# Patient Record
Sex: Female | Born: 1950 | Race: White | Hispanic: No | Marital: Married | Smoking: Never smoker
Health system: Southern US, Community
[De-identification: ages and names within clinical notes are randomized; demographics above are authoritative.]

## PROBLEM LIST (undated history)

## (undated) DIAGNOSIS — Z780 Asymptomatic menopausal state: Secondary | ICD-10-CM

## (undated) DIAGNOSIS — F32A Depression, unspecified: Secondary | ICD-10-CM

## (undated) DIAGNOSIS — B009 Herpesviral infection, unspecified: Secondary | ICD-10-CM

## (undated) DIAGNOSIS — M7070 Other bursitis of hip, unspecified hip: Secondary | ICD-10-CM

## (undated) DIAGNOSIS — N858 Other specified noninflammatory disorders of uterus: Secondary | ICD-10-CM

## (undated) DIAGNOSIS — M1990 Unspecified osteoarthritis, unspecified site: Secondary | ICD-10-CM

## (undated) DIAGNOSIS — M810 Age-related osteoporosis without current pathological fracture: Secondary | ICD-10-CM

## (undated) DIAGNOSIS — N941 Unspecified dyspareunia: Secondary | ICD-10-CM

## (undated) DIAGNOSIS — C801 Malignant (primary) neoplasm, unspecified: Secondary | ICD-10-CM

## (undated) DIAGNOSIS — F419 Anxiety disorder, unspecified: Secondary | ICD-10-CM

## (undated) DIAGNOSIS — E039 Hypothyroidism, unspecified: Secondary | ICD-10-CM

## (undated) DIAGNOSIS — T753XXA Motion sickness, initial encounter: Secondary | ICD-10-CM

## (undated) DIAGNOSIS — K219 Gastro-esophageal reflux disease without esophagitis: Secondary | ICD-10-CM

## (undated) DIAGNOSIS — N83209 Unspecified ovarian cyst, unspecified side: Secondary | ICD-10-CM

## (undated) DIAGNOSIS — F329 Major depressive disorder, single episode, unspecified: Secondary | ICD-10-CM

## (undated) HISTORY — DX: Other bursitis of hip, unspecified hip: M70.70

## (undated) HISTORY — DX: Unspecified ovarian cyst, unspecified side: N83.209

## (undated) HISTORY — PX: BASAL CELL CARCINOMA EXCISION: SHX1214

## (undated) HISTORY — PX: BILATERAL SALPINGOOPHORECTOMY: SHX1223

## (undated) HISTORY — PX: MYOMECTOMY: SHX85

## (undated) HISTORY — DX: Age-related osteoporosis without current pathological fracture: M81.0

## (undated) HISTORY — PX: UPPER GI ENDOSCOPY: SHX6162

## (undated) HISTORY — DX: Herpesviral infection, unspecified: B00.9

## (undated) HISTORY — PX: OOPHORECTOMY: SHX86

## (undated) HISTORY — DX: Unspecified dyspareunia: N94.10

## (undated) HISTORY — DX: Other specified noninflammatory disorders of uterus: N85.8

## (undated) HISTORY — DX: Asymptomatic menopausal state: Z78.0

---

## 1977-12-29 HISTORY — PX: AUGMENTATION MAMMAPLASTY: SUR837

## 2006-01-02 ENCOUNTER — Ambulatory Visit: Payer: Self-pay | Admitting: Family Medicine

## 2006-01-06 ENCOUNTER — Ambulatory Visit: Payer: Self-pay | Admitting: Family Medicine

## 2006-04-30 ENCOUNTER — Ambulatory Visit: Payer: Self-pay | Admitting: Surgery

## 2006-11-03 ENCOUNTER — Ambulatory Visit: Payer: Self-pay | Admitting: Surgery

## 2007-02-15 ENCOUNTER — Ambulatory Visit: Payer: Self-pay | Admitting: Family Medicine

## 2008-02-14 ENCOUNTER — Ambulatory Visit: Payer: Self-pay | Admitting: Family Medicine

## 2008-02-24 ENCOUNTER — Ambulatory Visit: Payer: Self-pay | Admitting: Family Medicine

## 2009-02-26 ENCOUNTER — Ambulatory Visit: Payer: Self-pay | Admitting: Family Medicine

## 2010-03-11 ENCOUNTER — Ambulatory Visit: Payer: Self-pay | Admitting: Family Medicine

## 2011-03-20 ENCOUNTER — Ambulatory Visit: Payer: Self-pay | Admitting: Family Medicine

## 2011-12-30 HISTORY — PX: BREAST BIOPSY: SHX20

## 2012-03-23 ENCOUNTER — Ambulatory Visit: Payer: Self-pay | Admitting: Family Medicine

## 2012-03-25 ENCOUNTER — Ambulatory Visit: Payer: Self-pay | Admitting: Family Medicine

## 2012-06-28 ENCOUNTER — Ambulatory Visit: Payer: Self-pay | Admitting: Surgery

## 2012-07-26 ENCOUNTER — Ambulatory Visit: Payer: Self-pay | Admitting: Surgery

## 2012-07-28 LAB — PATHOLOGY REPORT

## 2012-08-24 DIAGNOSIS — C44310 Basal cell carcinoma of skin of unspecified parts of face: Secondary | ICD-10-CM | POA: Insufficient documentation

## 2013-03-30 ENCOUNTER — Ambulatory Visit: Payer: Self-pay | Admitting: Family Medicine

## 2013-08-26 DIAGNOSIS — Z85828 Personal history of other malignant neoplasm of skin: Secondary | ICD-10-CM | POA: Insufficient documentation

## 2013-08-26 DIAGNOSIS — Z87898 Personal history of other specified conditions: Secondary | ICD-10-CM | POA: Insufficient documentation

## 2014-03-29 LAB — HM PAP SMEAR

## 2014-04-11 ENCOUNTER — Ambulatory Visit: Payer: Self-pay | Admitting: Family Medicine

## 2014-05-16 DIAGNOSIS — M7070 Other bursitis of hip, unspecified hip: Secondary | ICD-10-CM | POA: Insufficient documentation

## 2014-06-29 DIAGNOSIS — M810 Age-related osteoporosis without current pathological fracture: Secondary | ICD-10-CM | POA: Insufficient documentation

## 2014-06-29 DIAGNOSIS — E8940 Asymptomatic postprocedural ovarian failure: Secondary | ICD-10-CM | POA: Insufficient documentation

## 2014-06-29 DIAGNOSIS — F419 Anxiety disorder, unspecified: Secondary | ICD-10-CM | POA: Insufficient documentation

## 2014-06-29 DIAGNOSIS — E039 Hypothyroidism, unspecified: Secondary | ICD-10-CM | POA: Insufficient documentation

## 2014-06-29 DIAGNOSIS — F329 Major depressive disorder, single episode, unspecified: Secondary | ICD-10-CM | POA: Insufficient documentation

## 2014-06-29 DIAGNOSIS — F32A Depression, unspecified: Secondary | ICD-10-CM | POA: Insufficient documentation

## 2014-08-10 ENCOUNTER — Ambulatory Visit: Payer: Self-pay | Admitting: Unknown Physician Specialty

## 2014-08-28 DIAGNOSIS — M5136 Other intervertebral disc degeneration, lumbar region: Secondary | ICD-10-CM | POA: Insufficient documentation

## 2014-08-28 DIAGNOSIS — M5410 Radiculopathy, site unspecified: Secondary | ICD-10-CM | POA: Insufficient documentation

## 2014-09-19 ENCOUNTER — Ambulatory Visit: Payer: Self-pay | Admitting: Obstetrics and Gynecology

## 2014-09-19 LAB — CBC
HCT: 41.8 % (ref 35.0–47.0)
HGB: 14.1 g/dL (ref 12.0–16.0)
MCH: 31.2 pg (ref 26.0–34.0)
MCHC: 33.8 g/dL (ref 32.0–36.0)
MCV: 92 fL (ref 80–100)
Platelet: 188 10*3/uL (ref 150–440)
RBC: 4.52 10*6/uL (ref 3.80–5.20)
RDW: 13 % (ref 11.5–14.5)
WBC: 4.2 10*3/uL (ref 3.6–11.0)

## 2014-09-19 LAB — BASIC METABOLIC PANEL
ANION GAP: 3 — AB (ref 7–16)
BUN: 14 mg/dL (ref 7–18)
CHLORIDE: 104 mmol/L (ref 98–107)
CO2: 32 mmol/L (ref 21–32)
CREATININE: 0.67 mg/dL (ref 0.60–1.30)
Calcium, Total: 9.4 mg/dL (ref 8.5–10.1)
EGFR (Non-African Amer.): >60
Glucose: 83 mg/dL (ref 65–99)
Osmolality: 277 (ref 275–301)
Potassium: 3.9 mmol/L (ref 3.5–5.1)
Sodium: 139 mmol/L (ref 136–145)

## 2014-09-25 ENCOUNTER — Ambulatory Visit: Payer: Self-pay | Admitting: Obstetrics and Gynecology

## 2014-09-28 LAB — PATHOLOGY REPORT

## 2014-11-06 DIAGNOSIS — Z9109 Other allergy status, other than to drugs and biological substances: Secondary | ICD-10-CM | POA: Insufficient documentation

## 2015-04-21 NOTE — Op Note (Signed)
PATIENT NAME:  Margaret Berg, Margaret Berg MR#:  [id number] DATE OF BIRTH:  20-Aug-1951  DATE OF PROCEDURE:  09/25/2014  PREOPERATIVE DIAGNOSIS: Right adnexal mass.   POSTOPERATIVE DIAGNOSIS: Pedunculated leiomyoma uteri.   OPERATIVE PROCEDURE: Laparoscopic bilateral salpingo-oophorectomy and myomectomy.   SURGEON: Dr. Enzo Bi.   FIRST ASSISTANT: Dr. Marcelline Mates.   ANESTHESIA: General endotracheal.   INDICATIONS: The patient is a 64 year old white female, P2, 0-0-2, menopausal, on Prempro HRT therapy, who presents for surgical excision of right adnexal mass BSO. During recent workup for hip pain, MRI demonstrated a solid lesion with vascular blood flow adjacent to the uterus on the right side. Ultrasound confirmed findings. She is here for definitive treatment.   FINDINGS AT SURGERY: Revealed a 4 x 2-cm pedunculated fibroid extending from the right uterine cornu. There also was a 0.5-cm fibroid coming off of the left uterine cornu. Both lesions were excised. The tubes and ovaries were grossly normal bilaterally.   DESCRIPTION OF PROCEDURE: The patient was brought to the operating room where she was placed in the supine position. General endotracheal anesthesia was induced without difficulty. She was placed in the dorsal lithotomy position using the bumblebee stirrups. A ChloraPrep and Betadine abdominal, perineal, intravaginal prep and drape was performed in standard fashion. A red Robinson catheter was used to drain 75 mL of urine from the bladder. A Hulka tenaculum was placed onto the cervix. The endocervical canal had to be dilated with Hanks dilators. Small uterine perforation was noted which was asymptomatic and nonbleeding at the end of the case. Following placement of the Hulka tenaculum, the laparoscopy was performed.   A subumbilical vertical incision 5 mm in length was made. The Optiview laparoscopic trocar system was used to place a 5-mm camera directly into the subumbilical incision under direct  visualization. No bowel or vascular injury was encountered. Pneumoperitoneum was created with CO2 gas. An 11-mm port was placed under direct visualization in the right lower quadrant. A 5-mm port was placed likewise in the left lower quadrant. The above-noted findings were photo documented. The myomectomy was performed using the Ace Harmonic scalpel, cutting across the base of the pedunculated fibroid. Following the myomectomy, the bilateral salpingo-oophorectomy was performed in routine manner. A grasper was used to isolate the adnexa. The Ace Harmonic scalpel was used to desiccate, coagulate, and excise the tube and ovary through sequential bites. Once the adnexa were removed, the suture line was cauterized using Kleppinger bipolar forceps in order to optimize hemostasis. A similar procedure was carried out until on the contralateral tube. Finally, a small 0.5-cm subserosal fibroid at the left cornu of the uterus was excised using the Ace Harmonic scalpel. Again, Kleppinger bipolar cautery was used for hemostasis. The tubes and ovaries were removed through the 11-mm port. The EndoCatch bag system was then placed with the fibroid being placed into the bag for removal. The 11-mm port site was slightly dilated in order to allow complete removal of the fibroid intact. The pelvis was then laparoscopically inspected following removal of the specimens. Copious irrigation was performed and the irrigant fluid was aspirated. The procedure was then terminated with the abdomen being closed. The pneumoperitoneum was released. The incisions were closed with 0 Vicryl on the fascia in the 11-mm port site. Two figure-of-8 sutures were placed. The skin incisions were closed with 4-0 Vicryl. Dermabond glue was placed over the incisions. The patient was then awakened, extubated and taken to the recovery room in satisfactory condition.   ESTIMATED BLOOD LOSS: 25 mL.  IV FLUIDS: Were 600 mL.   URINE OUTPUT: 75 mL.   COUNTS:  All instrument, needle, and sponge counts were verified as correct.    ____________________________ Alanda Slim. Olon Russ, MD mad:lt D: 09/25/2014 14:47:04 ET T: 09/25/2014 15:23:37 ET JOB#: [id number]  cc: Hassell Done A. Syriana Croslin, MD, <Dictator>  Alanda Slim Chaundra Abreu MD ELECTRONICALLY SIGNED 09/28/2014 12:34

## 2015-05-17 ENCOUNTER — Other Ambulatory Visit: Payer: Self-pay | Admitting: Family Medicine

## 2015-05-17 DIAGNOSIS — Z1231 Encounter for screening mammogram for malignant neoplasm of breast: Secondary | ICD-10-CM

## 2015-06-22 ENCOUNTER — Other Ambulatory Visit: Payer: Self-pay | Admitting: Family Medicine

## 2015-06-22 ENCOUNTER — Ambulatory Visit
Admission: RE | Admit: 2015-06-22 | Discharge: 2015-06-22 | Disposition: A | Discharge status: Home / Self Care | Payer: 59 | Source: Ambulatory Visit | Attending: Family Medicine | Admitting: Family Medicine

## 2015-06-22 DIAGNOSIS — Z9882 Breast implant status: Secondary | ICD-10-CM | POA: Diagnosis not present

## 2015-06-22 DIAGNOSIS — Z1231 Encounter for screening mammogram for malignant neoplasm of breast: Secondary | ICD-10-CM | POA: Insufficient documentation

## 2015-06-25 ENCOUNTER — Other Ambulatory Visit: Payer: Self-pay | Admitting: Family Medicine

## 2015-06-25 DIAGNOSIS — N6489 Other specified disorders of breast: Secondary | ICD-10-CM

## 2015-06-25 DIAGNOSIS — R928 Other abnormal and inconclusive findings on diagnostic imaging of breast: Secondary | ICD-10-CM

## 2015-06-26 ENCOUNTER — Ambulatory Visit
Admission: RE | Admit: 2015-06-26 | Discharge: 2015-06-26 | Disposition: A | Discharge status: Home / Self Care | Payer: 59 | Source: Ambulatory Visit | Attending: Family Medicine | Admitting: Family Medicine

## 2015-06-26 ENCOUNTER — Other Ambulatory Visit: Payer: Self-pay | Admitting: Family Medicine

## 2015-06-26 DIAGNOSIS — N6489 Other specified disorders of breast: Secondary | ICD-10-CM | POA: Diagnosis present

## 2015-06-26 DIAGNOSIS — N6002 Solitary cyst of left breast: Secondary | ICD-10-CM | POA: Diagnosis not present

## 2015-06-26 DIAGNOSIS — R928 Other abnormal and inconclusive findings on diagnostic imaging of breast: Secondary | ICD-10-CM

## 2015-07-19 ENCOUNTER — Encounter: Payer: Self-pay | Admitting: *Deleted

## 2015-07-20 ENCOUNTER — Ambulatory Visit: Payer: 59 | Admitting: Anesthesiology

## 2015-07-20 ENCOUNTER — Encounter
Admission: RE | Disposition: A | Discharge status: Home / Self Care | Payer: Self-pay | Source: Ambulatory Visit | Attending: Gastroenterology

## 2015-07-20 ENCOUNTER — Ambulatory Visit
Admission: RE | Admit: 2015-07-20 | Discharge: 2015-07-20 | Disposition: A | Discharge status: Home / Self Care | Payer: 59 | Source: Ambulatory Visit | Attending: Gastroenterology | Admitting: Gastroenterology

## 2015-07-20 DIAGNOSIS — Z1211 Encounter for screening for malignant neoplasm of colon: Secondary | ICD-10-CM | POA: Diagnosis present

## 2015-07-20 DIAGNOSIS — D125 Benign neoplasm of sigmoid colon: Secondary | ICD-10-CM | POA: Insufficient documentation

## 2015-07-20 DIAGNOSIS — E039 Hypothyroidism, unspecified: Secondary | ICD-10-CM | POA: Insufficient documentation

## 2015-07-20 HISTORY — DX: Anxiety disorder, unspecified: F41.9

## 2015-07-20 HISTORY — DX: Major depressive disorder, single episode, unspecified: F32.9

## 2015-07-20 HISTORY — DX: Depression, unspecified: F32.A

## 2015-07-20 HISTORY — DX: Malignant (primary) neoplasm, unspecified: C80.1

## 2015-07-20 HISTORY — PX: COLONOSCOPY WITH PROPOFOL: SHX5780

## 2015-07-20 HISTORY — DX: Unspecified osteoarthritis, unspecified site: M19.90

## 2015-07-20 HISTORY — DX: Hypothyroidism, unspecified: E03.9

## 2015-07-20 SURGERY — COLONOSCOPY WITH PROPOFOL
Anesthesia: General

## 2015-07-20 MED ORDER — PROPOFOL 10 MG/ML IV BOLUS
INTRAVENOUS | Status: DC | PRN
  Administered 2015-07-20 (×4): 20 mg via INTRAVENOUS

## 2015-07-20 MED ORDER — SODIUM CHLORIDE 0.9 % IV SOLN
INTRAVENOUS | Status: DC
  Administered 2015-07-20: 1000 mL via INTRAVENOUS

## 2015-07-20 MED ORDER — SODIUM CHLORIDE 0.9 % IV SOLN
INTRAVENOUS | Status: DC
  Administered 2015-07-20: 12:00:00 via INTRAVENOUS

## 2015-07-20 MED ORDER — MIDAZOLAM HCL 2 MG/2ML IJ SOLN
INTRAMUSCULAR | Status: DC | PRN
  Administered 2015-07-20: 1 mg via INTRAVENOUS

## 2015-07-20 NOTE — Anesthesia Preprocedure Evaluation (Signed)
Anesthesia Evaluation  Patient identified by MRN, date of birth, ID band Patient awake    Reviewed: Allergy & Precautions, H&P , NPO status , Patient's Chart, lab work & pertinent test results, reviewed documented beta blocker date and time   Airway Mallampati: II  TM Distance: >3 FB Neck ROM: full    Dental no notable dental hx.    Pulmonary neg pulmonary ROS,  breath sounds clear to auscultation  Pulmonary exam normal       Cardiovascular Exercise Tolerance: Good negative cardio ROS  Rhythm:regular Rate:Normal     Neuro/Psych negative neurological ROS  negative psych ROS   GI/Hepatic negative GI ROS, Neg liver ROS,   Endo/Other  negative endocrine ROSHypothyroidism   Renal/GU negative Renal ROS  negative genitourinary   Musculoskeletal   Abdominal   Peds  Hematology negative hematology ROS (+)   Anesthesia Other Findings   Reproductive/Obstetrics negative OB ROS                             Anesthesia Physical Anesthesia Plan  ASA: II  Anesthesia Plan: General   Post-op Pain Management:    Induction:   Airway Management Planned:   Additional Equipment:   Intra-op Plan:   Post-operative Plan:   Informed Consent: I have reviewed the patients History and Physical, chart, labs and discussed the procedure including the risks, benefits and alternatives for the proposed anesthesia with the patient or authorized representative who has indicated his/her understanding and acceptance.   Dental Advisory Given  Plan Discussed with: Anesthesiologist  Anesthesia Plan Comments:         Anesthesia Quick Evaluation

## 2015-07-20 NOTE — Transfer of Care (Signed)
Immediate Anesthesia Transfer of Care Note  Patient: Margaret Berg  Procedure(s) Performed: Procedure(s): COLONOSCOPY WITH PROPOFOL (N/A)  Patient Location: PACU, Nursing Unit and Endoscopy Unit  Anesthesia Type:General  Level of Consciousness: oriented and sedated  Airway & Oxygen Therapy: Patient Spontanous Breathing  Post-op Assessment: Report given to RN  Post vital signs: stable  Last Vitals:  Filed Vitals:   07/20/15 1217  BP: 147/69  Pulse: 76  Temp: 36.6 C  Resp: 16    Complications: No apparent anesthesia complications

## 2015-07-20 NOTE — H&P (Signed)
Outpatient short stay form Pre-procedure 07/20/2015 11:43 AM Lollie Sails MD  Primary Physician: Dr. Thereasa Distance  Reason for visit:  Colonoscopy  History of present illness:  Patient is a 64 year old female presenting today for screening colonoscopy. Her last colonoscopy was 2007. It did not show any evidence of colon polyps. He tolerated her prep well. Takes no aspirin or anticoagulation treatment. There is no family history colon cancer colon polyps.    Current facility-administered medications:  .  0.9 %  sodium chloride infusion, , Intravenous, Continuous, Lollie Sails, MD .  0.9 %  sodium chloride infusion, , Intravenous, Continuous, Lollie Sails, MD, Last Rate: 20 mL/hr at 07/20/15 1141, 1,000 mL at 07/20/15 1141  Prescriptions prior to admission  Medication Sig Dispense Refill Last Dose  . cetirizine (ZYRTEC) 10 MG tablet Take 10 mg by mouth daily.   07/19/2015  . estrogen, conjugated,-medroxyprogesterone (PREMPRO) 0.3-1.5 MG per tablet Take 1 tablet by mouth daily.   07/19/2015  . LORazepam (ATIVAN) 0.5 MG tablet Take 0.5 mg by mouth at bedtime as needed for anxiety.   07/19/2015  . venlafaxine XR (EFFEXOR-XR) 150 MG 24 hr capsule Take 150 mg by mouth daily.   07/19/2015     Allergies  Allergen Reactions  . Codeine Nausea And Vomiting     Past Medical History  Diagnosis Date  . Cancer   . Anxiety   . Hypothyroidism   . Depression   . Arthritis     Review of systems:      Physical Exam    Heart and lungs: Regular rate and rhythm without rub murmur gallop, lungs are bilaterally clear.    HEENT: Normocephalic atraumatic eyes are anicteric    Other:     Pertinant exam for procedure: Soft nontender nondistended bowel sounds positive normoactive    Planned proceedures: Colonoscopy and indicated procedures I have discussed the risks benefits and complications of procedures to include not limited to bleeding, infection, perforation and the risk of  sedation and the patient wishes to proceed. I have discussed the risks benefits and complications of procedures to include not limited to bleeding, infection, perforation and the risk of sedation and the patient wishes to proceed.    Lollie Sails, MD Gastroenterology 07/20/2015  11:43 AM

## 2015-07-20 NOTE — Op Note (Signed)
[facility] Patient Name: Margaret Berg Procedure Date: 07/20/2015 11:27 AM MRN: [id number] Account #: [id number] Date of Birth: 1951/12/25 Admit Type: Outpatient Age: 64 Room: [facility] ENDO ROOM 2 Gender: Female Note Status: Finalized Procedure:         Colonoscopy Indications:       Screening for colorectal malignant neoplasm Providers:         Lollie Sails, MD Referring MD:      Sofie Hartigan (Referring MD) Medicines:         Monitored Anesthesia Care Complications:     No immediate complications. Procedure:         Pre-Anesthesia Assessment:                    - ASA Grade Assessment: II - A patient with mild systemic                     disease.                    After obtaining informed consent, the colonoscope was                     passed under direct vision. Throughout the procedure, the                     patient's blood pressure, pulse, and oxygen saturations                     were monitored continuously. The Olympus PCF-160AL                     colonoscope (S#. [id number]) was introduced through the anus                     and advanced to the the cecum, identified by appendiceal                     orifice and ileocecal valve. The colonoscopy was performed                     without difficulty. The patient tolerated the procedure                     well. The quality of the bowel preparation was good. Findings:      A 4 mm polyp was found in the proximal sigmoid colon. The polyp was       flat. The polyp was removed with a cold biopsy forceps. Resection and       retrieval were complete.      there is a smal mucosal limited split in the upper edge of a fold at       about 52 cm from the anal verge, noted on withdrawn, likely from scope       passage.      The exam was otherwise without abnormality.      The digital rectal exam was normal. Impression:        - One 4 mm polyp in the proximal sigmoid colon. Resected                 and retrieved.                    - The examination was otherwise normal. Recommendation:    - Await pathology results.                    -  Telephone GI clinic for pathology results in 1 week. Procedure Code(s): --- Professional ---                    [phone], Colonoscopy, flexible; with biopsy, single or                     multiple Diagnosis Code(s): --- Professional ---                    V76.51, Special screening for malignant neoplasms of colon                    211.3, Benign neoplasm of colon CPT copyright 2014 American Medical Association. All rights reserved. The codes documented in this report are preliminary and upon coder review may  be revised to meet current compliance requirements. Lollie Sails, MD 07/20/2015 12:20:25 PM This report has been signed electronically. Number of Addenda: 0 Note Initiated On: 07/20/2015 11:27 AM Scope Withdrawal Time: 0 hours 11 minutes 38 seconds  Total Procedure Duration: 0 hours 19 minutes 35 seconds       [facility]

## 2015-07-20 NOTE — Anesthesia Postprocedure Evaluation (Signed)
  Anesthesia Post-op Note  Patient: Margaret Berg  Procedure(s) Performed: Procedure(s): COLONOSCOPY WITH PROPOFOL (N/A)  Anesthesia type:General  Patient location: PACU  Post pain: Pain level controlled  Post assessment: Post-op Vital signs reviewed, Patient's Cardiovascular Status Stable, Respiratory Function Stable, Patent Airway and No signs of Nausea or vomiting  Post vital signs: Reviewed and stable  Last Vitals:  Filed Vitals:   07/20/15 1217  BP: 147/69  Pulse: 76  Temp: 36.6 C  Resp: 16    Level of consciousness: awake, alert  and patient cooperative  Complications: No apparent anesthesia complications

## 2015-07-23 ENCOUNTER — Encounter: Payer: Self-pay | Admitting: Gastroenterology

## 2015-07-24 LAB — SURGICAL PATHOLOGY

## 2016-01-14 ENCOUNTER — Other Ambulatory Visit: Payer: Self-pay | Admitting: Obstetrics and Gynecology

## 2016-02-26 ENCOUNTER — Encounter: Payer: Self-pay | Admitting: Obstetrics and Gynecology

## 2016-02-26 ENCOUNTER — Ambulatory Visit (INDEPENDENT_AMBULATORY_CARE_PROVIDER_SITE_OTHER): Payer: 59 | Admitting: Obstetrics and Gynecology

## 2016-02-26 VITALS — BP 148/77 | HR 86 | Ht 63.0 in | Wt 146.2 lb

## 2016-02-26 DIAGNOSIS — N951 Menopausal and female climacteric states: Secondary | ICD-10-CM | POA: Diagnosis not present

## 2016-02-26 DIAGNOSIS — M810 Age-related osteoporosis without current pathological fracture: Secondary | ICD-10-CM

## 2016-02-26 DIAGNOSIS — Z79890 Hormone replacement therapy (postmenopausal): Secondary | ICD-10-CM

## 2016-02-26 MED ORDER — CONJ ESTROG-MEDROXYPROGEST ACE 0.3-1.5 MG PO TABS: 1.0000 | ORAL_TABLET | Freq: Every day | ORAL | Status: DC

## 2016-02-26 NOTE — Progress Notes (Signed)
Patient ID: Margaret Berg, female   DOB: 1951/09/30, 65 y.o.   MRN: [id number] ANNUAL PREVENTATIVE CARE GYN  ENCOUNTER NOTE  Subjective:       Margaret Berg is a 65 y.o. G60P2002 female here for a routine annual gynecologic exam.  Current complaints: 1.  none  2.  Status post laparoscopic BSO and myomectomy, on HRT, asymptomatic   Gynecologic History No LMP recorded. Patient is postmenopausal. Contraception: post menopausal status Last Pap: 03/2014. Results were: normal Last mammogram: 2016 thru pcp. Results were: normal Status post laparoscopic BSO and myomectomy. On Prempro, asymptomatic; patient desires to continue  Obstetric History OB History  Gravida Para Term Preterm AB SAB TAB Ectopic Multiple Living  2 2 2       2     # Outcome Date GA Lbr Len/2nd Weight Sex Delivery Anes PTL Lv  2 Term 1971   8 lb 2.2 oz (3.692 kg) M Vag-Spont   Y  1 Term 1969     Vag-Spont   Y      Past Medical History  Diagnosis Date  . Anxiety   . Hypothyroidism   . HSV-2 (herpes simplex virus 2) infection   . Arthritis   . Bursitis of hip     left  . Cancer ([place])     basal cell  . Depression   . Dyspareunia, female   . Menopause   . Ovarian cystic mass   . Osteoporosis   . Uterine mass     Past Surgical History  Procedure Laterality Date  . Colonoscopy with propofol N/A 07/20/2015    Procedure: COLONOSCOPY WITH PROPOFOL;  Surgeon: Lollie Sails, MD;  Location: [facility] ENDOSCOPY;  Service: Endoscopy;  Laterality: N/A;  . Breast biopsy Right     ultrasound guided benign  . Augmentation mammaplasty Bilateral 1979  . Bilateral salpingoophorectomy    . Myomectomy    . Basal cell carcinoma excision      Current Outpatient Prescriptions on File Prior to Visit  Medication Sig Dispense Refill  . cetirizine (ZYRTEC) 10 MG tablet Take 10 mg by mouth daily.    . Cholecalciferol (VITAMIN D3) 2000 units capsule Take by mouth.    Marland Kitchen LORazepam (ATIVAN) 0.5 MG tablet Take 0.5 mg by mouth at  bedtime as needed for anxiety.    Marland Kitchen PREMPRO 0.3-1.5 MG tablet Take 1 tablet by mouth  daily 84 tablet 0  . venlafaxine XR (EFFEXOR-XR) 150 MG 24 hr capsule Take 150 mg by mouth daily.     No current facility-administered medications on file prior to visit.    Allergies  Allergen Reactions  . Codeine Nausea And Vomiting    Social History   Social History  . Marital Status: Married    Spouse Name: N/A  . Number of Children: N/A  . Years of Education: N/A   Occupational History  . Not on file.   Social History Main Topics  . Smoking status: Never Smoker   . Smokeless tobacco: Not on file  . Alcohol Use: Yes     Comment: occas  . Drug Use: No  . Sexual Activity: Yes    Birth Control/ Protection: Surgical   Other Topics Concern  . Not on file   Social History Narrative    Family History  Problem Relation Age of Onset  . Osteoporosis Maternal Grandmother   . Diabetes Maternal Grandfather   . Cancer Neg Hx   . Heart disease Neg Hx  The following portions of the patient's history were reviewed and updated as appropriate: allergies, current medications, past family history, past medical history, past social history, past surgical history and problem list.  Review of Systems ROS Review of Systems - General ROS: negative for - chills, fatigue, fever, hot flashes, night sweats, weight gain or weight loss Psychological ROS: negative for - anxiety, decreased libido, depression, mood swings, physical abuse or sexual abuse Ophthalmic ROS: negative for - blurry vision, eye pain or loss of vision ENT ROS: negative for - headaches, hearing change, visual changes or vocal changes Allergy and Immunology ROS: negative for - hives, itchy/watery eyes or seasonal allergies Hematological and Lymphatic ROS: negative for - bleeding problems, bruising, swollen lymph nodes or weight loss Endocrine ROS: negative for - galactorrhea, hair pattern changes, hot flashes, malaise/lethargy, mood  swings, palpitations, polydipsia/polyuria, skin changes, temperature intolerance or unexpected weight changes Breast ROS: negative for - new or changing breast lumps or nipple discharge Respiratory ROS: negative for - cough or shortness of breath Cardiovascular ROS: negative for - chest pain, irregular heartbeat, palpitations or shortness of breath Gastrointestinal ROS: no abdominal pain, change in bowel habits, or black or bloody stools Genito-Urinary ROS: no dysuria, trouble voiding, or hematuria Musculoskeletal ROS: negative for - joint pain or joint stiffness Neurological ROS: negative for - bowel and bladder control changes Dermatological ROS: negative for rash and skin lesion changes   Objective:   BP 148/77 mmHg  Pulse 86  Ht 5\' 3"  (1.6 m)  Wt 146 lb 3.2 oz (66.316 kg)  BMI 25.90 kg/m2 CONSTITUTIONAL: Well-developed, well-nourished female in no acute distress.  PSYCHIATRIC: Normal mood and affect. Normal behavior. Normal judgment and thought content. [place]: Alert and oriented to person, place, and time. Normal muscle tone coordination. No cranial nerve deficit noted. HENT:  Normocephalic, atraumatic EYES: Conjunctivae and EOM are normal.  NECK: Normal range of motion, supple, no masses.  Normal thyroid.  SKIN: Skin is warm and dry. No rash noted. Not diaphoretic. No erythema. No pallor. CARDIOVASCULAR: not examined RESPIRATORY: Not examined BREASTS: Symmetric in size. No masses, skin changes, nipple drainage, or lymphadenopathy. Breast implants present ABDOMEN: Soft, normal bowel sounds, no distention noted.  No tenderness, rebound or guarding. No hernias BLADDER: Normal PELVIC:  External Genitalia: Normal  BUS: Normal  Vagina: Normal  Cervix: Normal  Uterus: Normal  Adnexa: Normal  RV: External Exam NormaI, No Rectal Masses and Normal Sphincter tone, thin sphincter and rectovaginal septum  MUSCULOSKELETAL: Normal range of motion. No tenderness.  No cyanosis, clubbing,  or edema.  2+ distal pulses. LYMPHATIC: No Axillary, Supraclavicular, or Inguinal Adenopathy.    Assessment:   Annual gynecologic examination 65 y.o. Contraception: post menopausal status Normal BMI Status post laparoscopic BSO and myomectomy 2016 On Prempro, asymptomatic, desires to continue  Plan:  Pap: Not needed Mammogram: thru pcp Stool Guaiac Testing:  Not Indicated- had colonoscopy 06/2015 - wnl Labs: thru pcp Routine preventative health maintenance measures emphasized: Exercise/Diet/Weight control, Tobacco Warnings and Alcohol/Substance use risks Refill Prempro. Continue with calcium and vitamin D supplementation Return to [place], [place]  Brayton Mars, MD

## 2016-02-26 NOTE — Patient Instructions (Signed)
1. No pap needed. 2. Mammogram scheduled 3. Prempro is refilled. 4. Increase exercise to maintain weight and health. 5. Return in 1 year.

## 2016-04-11 ENCOUNTER — Ambulatory Visit (INDEPENDENT_AMBULATORY_CARE_PROVIDER_SITE_OTHER): Payer: Medicare Other

## 2016-04-11 ENCOUNTER — Ambulatory Visit
Admission: EM | Admit: 2016-04-11 | Discharge: 2016-04-11 | Disposition: A | Discharge status: Home / Self Care | Payer: Medicare Other | Attending: Family Medicine | Admitting: Family Medicine

## 2016-04-11 ENCOUNTER — Encounter: Payer: Self-pay | Admitting: *Deleted

## 2016-04-11 DIAGNOSIS — R062 Wheezing: Secondary | ICD-10-CM

## 2016-04-11 DIAGNOSIS — J189 Pneumonia, unspecified organism: Secondary | ICD-10-CM

## 2016-04-11 MED ORDER — ALBUTEROL SULFATE HFA 108 (90 BASE) MCG/ACT IN AERS: 1.0000 | INHALATION_SPRAY | Freq: Four times a day (QID) | RESPIRATORY_TRACT | Status: DC | PRN

## 2016-04-11 MED ORDER — LEVOFLOXACIN 500 MG PO TABS: 500.0000 mg | ORAL_TABLET | Freq: Every day | ORAL | Status: DC

## 2016-04-11 MED ORDER — HYDROCOD POLST-CPM POLST ER 10-8 MG/5ML PO SUER: 5.0000 mL | Freq: Two times a day (BID) | ORAL | Status: DC | PRN

## 2016-04-11 NOTE — ED Notes (Signed)
Non-productive cough x3 weeks, onset of fever this past week, and last night onset of left side chest wall pain with coughing and movement.

## 2016-04-11 NOTE — ED Provider Notes (Signed)
CSN: [id number]     Arrival date & time 04/11/16  1431 History   First MD Initiated Contact with Patient 04/11/16 1544     Chief Complaint  Patient presents with  . Cough  . Pleurisy  . Fever   (Consider location/radiation/quality/duration/timing/severity/associated sxs/prior Treatment) Patient is a 65 y.o. female presenting with URI. The history is provided by the patient.  URI Presenting symptoms: congestion, cough and fever   Severity:  Moderate Onset quality:  Sudden Duration:  3 weeks Timing:  Constant Progression:  Worsening Chronicity:  New Relieved by:  Nothing Ineffective treatments:  OTC medications Associated symptoms: wheezing   Associated symptoms: no arthralgias, no headaches, no myalgias, no neck pain, no sinus pain, no sneezing and no swollen glands   Risk factors: sick contacts   Risk factors: not elderly, no chronic cardiac disease, no chronic kidney disease, no chronic respiratory disease, no diabetes mellitus, no immunosuppression, no recent illness and no recent travel     Past Medical History  Diagnosis Date  . Anxiety   . Hypothyroidism   . HSV-2 (herpes simplex virus 2) infection   . Arthritis   . Bursitis of hip     left  . Cancer ([place])     basal cell  . Depression   . Dyspareunia, female   . Menopause   . Ovarian cystic mass   . Osteoporosis   . Uterine mass    Past Surgical History  Procedure Laterality Date  . Colonoscopy with propofol N/A 07/20/2015    Procedure: COLONOSCOPY WITH PROPOFOL;  Surgeon: Lollie Sails, MD;  Location: [facility] ENDOSCOPY;  Service: Endoscopy;  Laterality: N/A;  . Breast biopsy Right     ultrasound guided benign  . Augmentation mammaplasty Bilateral 1979  . Bilateral salpingoophorectomy    . Myomectomy    . Basal cell carcinoma excision     Family History  Problem Relation Age of Onset  . Osteoporosis Maternal Grandmother   . Diabetes Maternal Grandfather   . Cancer Neg Hx   . Heart disease Neg Hx     Social History  Substance Use Topics  . Smoking status: Never Smoker   . Smokeless tobacco: None  . Alcohol Use: Yes     Comment: occas   OB History    Gravida Para Term Preterm AB TAB SAB Ectopic Multiple Living   2 2 2       2      Review of Systems  Constitutional: Positive for fever.  HENT: Positive for congestion. Negative for sneezing.   Respiratory: Positive for cough and wheezing.   Musculoskeletal: Negative for myalgias, arthralgias and neck pain.  Neurological: Negative for headaches.    Allergies  Codeine  Home Medications   Prior to Admission medications   Medication Sig Start Date End Date Taking? Authorizing Provider  Calcium-Vitamin D 600-200 MG-UNIT tablet Take by mouth. 08/24/12  Yes Historical Provider, MD  cetirizine (ZYRTEC) 10 MG tablet Take 10 mg by mouth daily.   Yes Historical Provider, MD  Cholecalciferol (VITAMIN D3) 2000 units capsule Take by mouth. 08/24/12  Yes Historical Provider, MD  estrogen, conjugated,-medroxyprogesterone (PREMPRO) 0.3-1.5 MG tablet Take 1 tablet by mouth daily. 02/26/16  Yes Alanda Slim Defrancesco, MD  LORazepam (ATIVAN) 0.5 MG tablet Take 0.5 mg by mouth at bedtime as needed for anxiety.   Yes Historical Provider, MD  venlafaxine XR (EFFEXOR-XR) 150 MG 24 hr capsule Take 150 mg by mouth daily.   Yes Historical Provider, MD  albuterol (  PROVENTIL HFA;VENTOLIN HFA) 108 (90 Base) MCG/ACT inhaler Inhale 1-2 puffs into the lungs every 6 (six) hours as needed for wheezing or shortness of breath. 04/11/16   Norval Gable, MD  chlorpheniramine-HYDROcodone (TUSSIONEX PENNKINETIC ER) 10-8 MG/5ML SUER Take 5 mLs by mouth every 12 (twelve) hours as needed. 04/11/16   Norval Gable, MD  levofloxacin (LEVAQUIN) 500 MG tablet Take 1 tablet (500 mg total) by mouth daily. 04/11/16   Norval Gable, MD   Meds Ordered and Administered this Visit  Medications - No data to display  BP 140/56 mmHg  Pulse 81  Temp(Src) 98.7 F (37.1 C) (Oral)  Resp  16  Ht 5\' 3"  (1.6 m)  Wt 141 lb (63.957 kg)  BMI 24.98 kg/m2  SpO2 98% No data found.   Physical Exam  Constitutional: She appears well-developed and well-nourished. No distress.  HENT:  Head: Normocephalic and atraumatic.  Right Ear: Tympanic membrane, external ear and ear canal normal.  Left Ear: Tympanic membrane, external ear and ear canal normal.  Nose: Mucosal edema and rhinorrhea present. No nose lacerations, sinus tenderness, nasal deformity, septal deviation or nasal septal hematoma. No epistaxis.  No foreign bodies.  Mouth/Throat: Uvula is midline, oropharynx is clear and moist and mucous membranes are normal. No oropharyngeal exudate.  Eyes: Conjunctivae and EOM are normal. Pupils are equal, round, and reactive to light. Right eye exhibits no discharge. Left eye exhibits no discharge. No scleral icterus.  Neck: Normal range of motion. Neck supple. No thyromegaly present.  Cardiovascular: Normal rate, regular rhythm and normal heart sounds.   Pulmonary/Chest: Effort normal. No respiratory distress. She has wheezes (few expiratory). She has no rales.  Lymphadenopathy:    She has no cervical adenopathy.  Skin: She is not diaphoretic.  Nursing note and vitals reviewed.   ED Course  Procedures (including critical care time)  Labs Review Labs Reviewed - No data to display  Imaging Review Dg Chest 2 View  04/11/2016  CLINICAL DATA:  Nonproductive cough and congestion for the past 3 weeks. Onset of left lower chest discomfort yesterday with indigestion. No history of trauma EXAM: CHEST  2 VIEW COMPARISON:  PA and lateral chest x-ray of September 19, 2014 FINDINGS: The lungs are mildly hyperinflated. The interstitial markings are coarse. This is especially conspicuous in the lower lobes bilaterally. The heart is normal in size. The pulmonary vascularity is not engorged. The mediastinum is normal in width. A calcified breast implant is present on the left. The implant on the right  does not exhibit capsular calcification. IMPRESSION: COPD with superimposed interstitial pneumonia at the lung bases. There is no CHF. Followup PA and lateral chest X-ray is recommended in 3-4 weeks following trial of antibiotic therapy to ensure resolution and exclude underlying malignancy. Electronically Signed   By: David  Martinique M.D.   On: 04/11/2016 15:43     Visual Acuity Review  Right Eye Distance:   Left Eye Distance:   Bilateral Distance:    Right Eye Near:   Left Eye Near:    Bilateral Near:         MDM   1. Bilateral pneumonia   2. Wheezing    Discharge Medication List as of 04/11/2016  4:55 PM    START taking these medications   Details  albuterol (PROVENTIL HFA;VENTOLIN HFA) 108 (90 Base) MCG/ACT inhaler Inhale 1-2 puffs into the lungs every 6 (six) hours as needed for wheezing or shortness of breath., Starting 04/11/2016, Until Discontinued, Normal    chlorpheniramine-HYDROcodone (  TUSSIONEX PENNKINETIC ER) 10-8 MG/5ML SUER Take 5 mLs by mouth every 12 (twelve) hours as needed., Starting 04/11/2016, Until Discontinued, Normal    levofloxacin (LEVAQUIN) 500 MG tablet Take 1 tablet (500 mg total) by mouth daily., Starting 04/11/2016, Until Discontinued, Normal       1. x-ray results and diagnosis reviewed with patient 2. rx as per orders above; reviewed possible side effects, interactions, risks and benefits  3. Recommend supportive treatment with otc analgesics, increased fluids 4. Follow-up prn if symptoms worsen or don't improve    Norval Gable, MD 04/11/16 1810

## 2016-05-19 ENCOUNTER — Other Ambulatory Visit: Payer: Self-pay | Admitting: Family Medicine

## 2016-05-19 DIAGNOSIS — Z1231 Encounter for screening mammogram for malignant neoplasm of breast: Secondary | ICD-10-CM

## 2016-06-07 IMAGING — MR MRI LUMBAR SPINE WITHOUT CONTRAST
4 of 5 series · 26 of 48 positions shown · non-contrast
Comparison: None.

CLINICAL DATA: Low back pain.  Left hip and leg pain for 1.5 years.

EXAM:
MRI LUMBAR SPINE WITHOUT CONTRAST
TECHNIQUE: Multiplanar, multisequence MR imaging of the lumbar spine was
performed. No intravenous contrast was administered.

[Series 2: T2 · sagittal · 4.0mm · 0.81mm/px · 6 of 16 slices shown (1 of 2)]
[im 1/16]
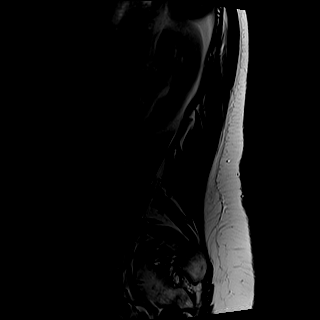
[im 4/16]
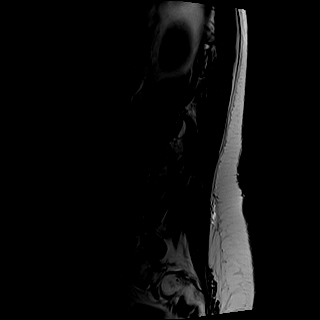
[im 7/16]
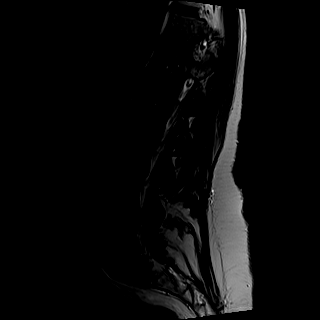
[im 10/16]
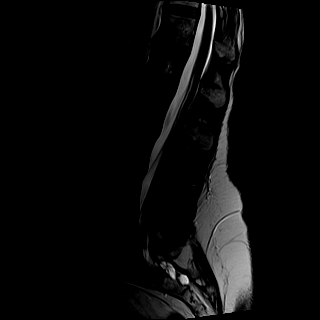
[im 13/16]
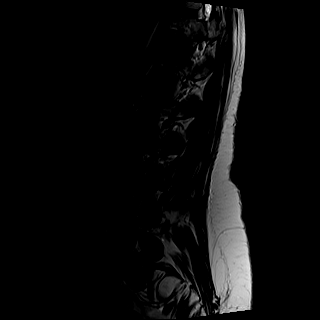
[im 16/16]
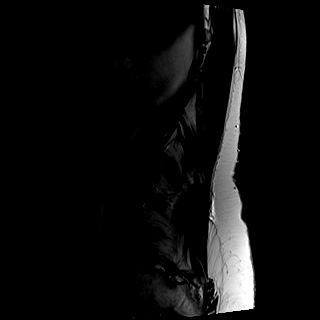

[Series 3: T1 · sagittal · 4.0mm · 0.41mm/px · 6 of 16 slices shown (1 of 2)]
[im 1/16]
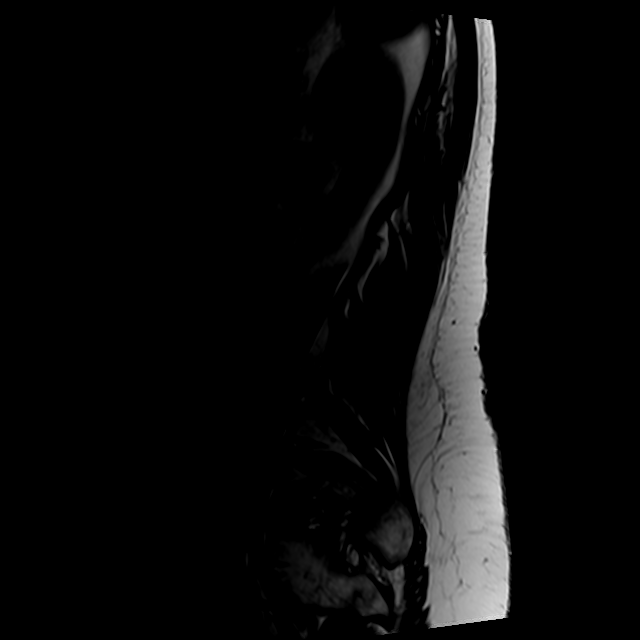
[im 4/16]
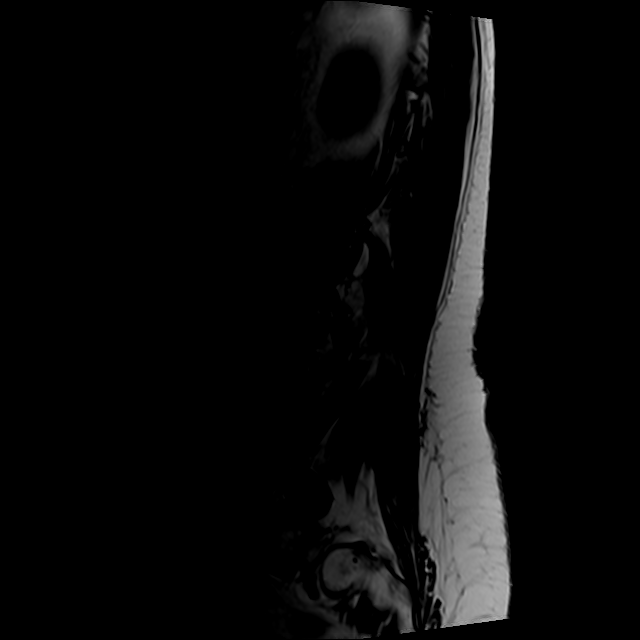
[im 7/16]
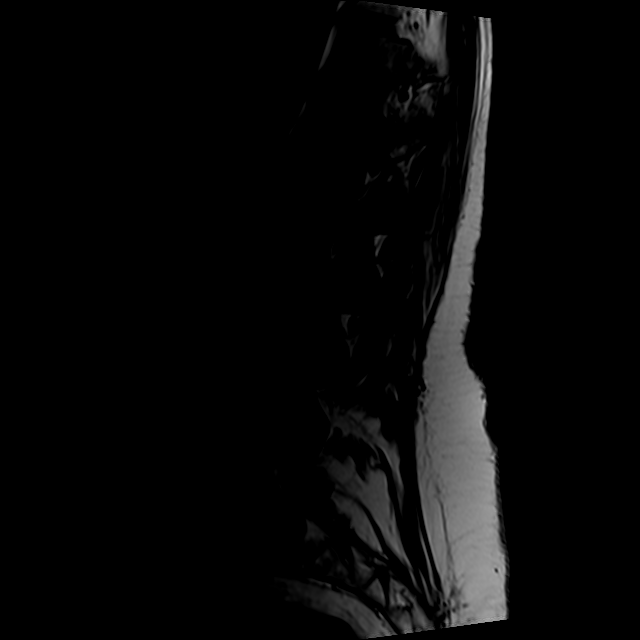
[im 10/16]
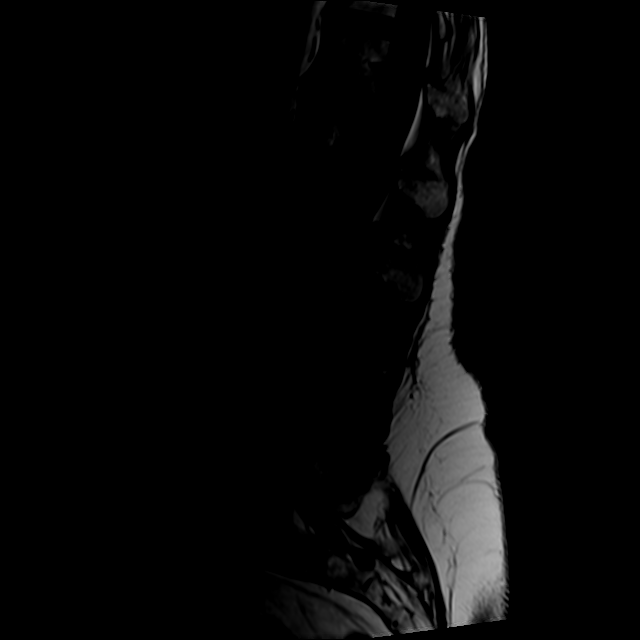
[im 13/16]
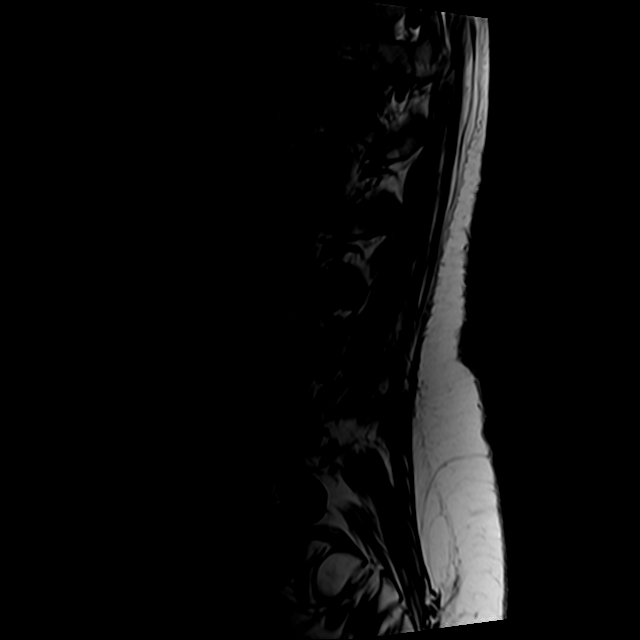
[im 16/16]
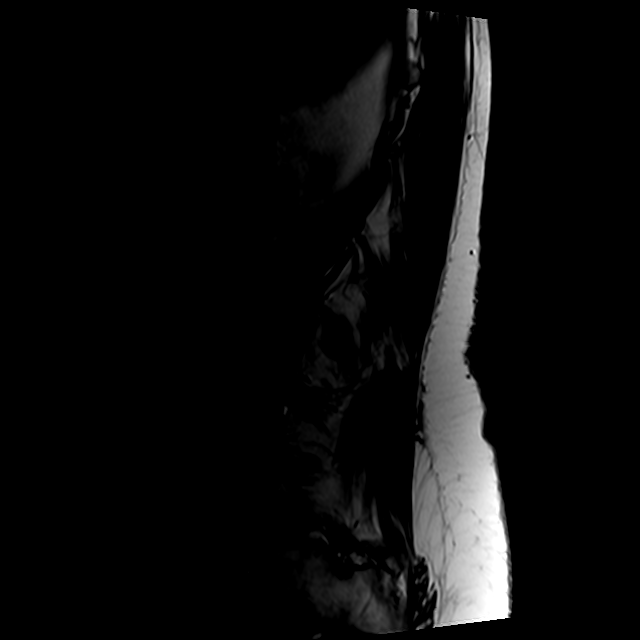

[Series 5: T2 · axial · 4.0mm · 0.78mm/px · z∈[-135,+87]mm · 9 of 41 slices shown (2 of 2)]
[im 1/41]
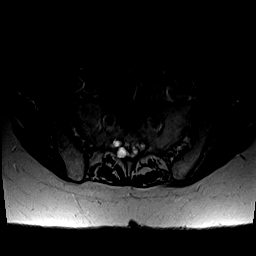
[im 6/41]
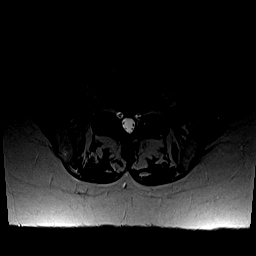
[im 12/41]
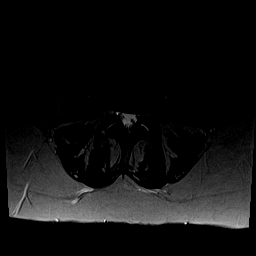
[im 18/41]
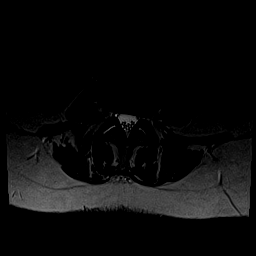
[im 21/41]
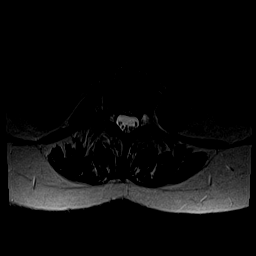
[im 23/41]
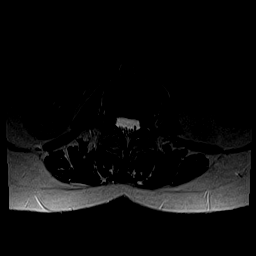
[im 29/41]
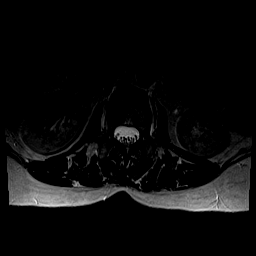
[im 35/41]
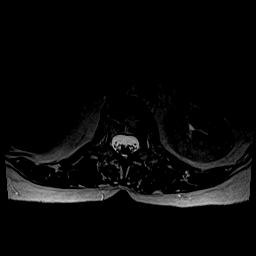
[im 41/41]
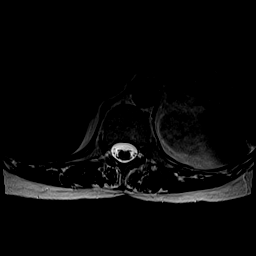

[Series 6: T1 · axial · 4.0mm · 0.39mm/px · z∈[-135,+57]mm · 5 of 41 slices shown (2 of 2)]
[im 1/41]
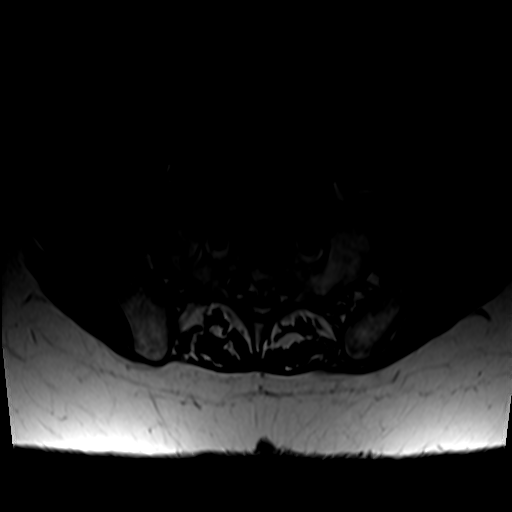
[im 6/41]
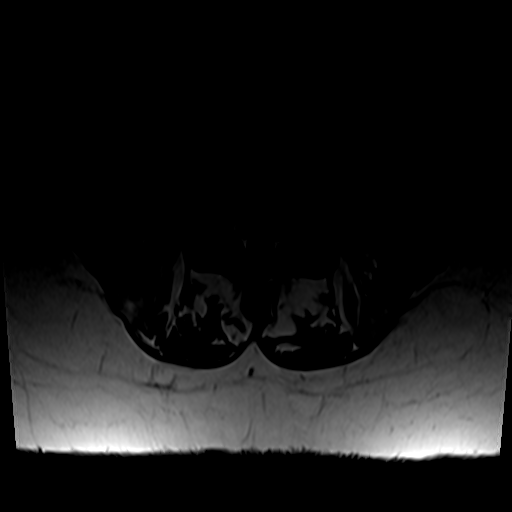
[im 12/41]
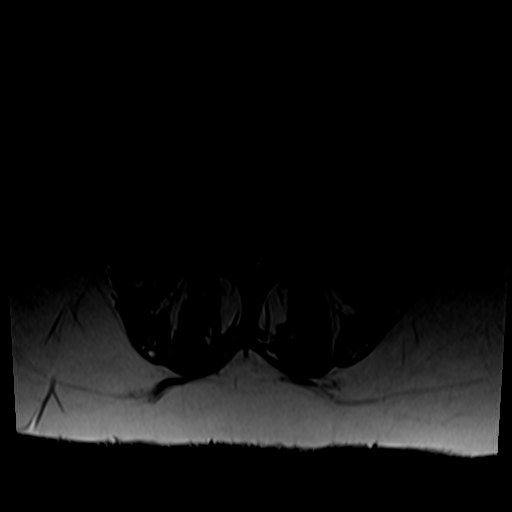
[im 21/41]
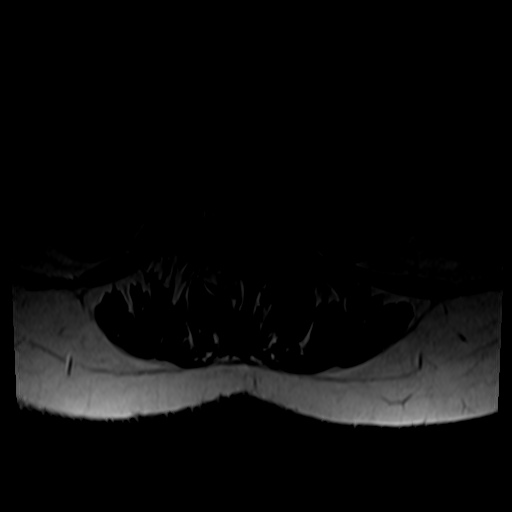
[im 35/41]
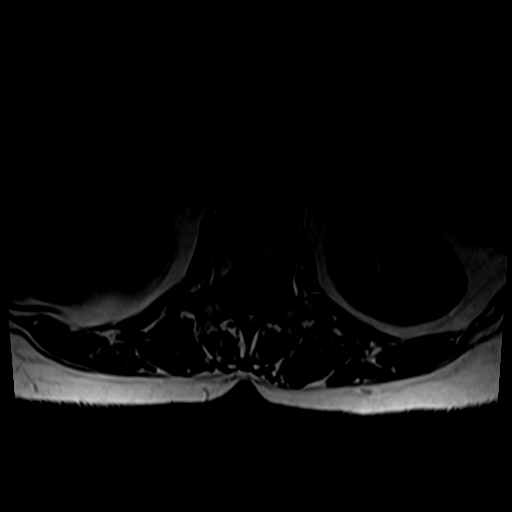

[26 of 48 positions shown; findings below may reference images not displayed]

FINDINGS: The lowest lumbar type non-rib-bearing vertebra is labeled as L5.
The conus medullaris appears normal. Conus level: L1-2.

No significant vertebral marrow edema is identified. No vertebral
subluxation is observed. A fluid signal intensity lesion of the
right kidney is partially included on today's exam and statistically
likely to represent a cyst although technically nonspecific.

Additional findings at individual levels are as follows:

L2-3:  No impingement.  Minimal disc bulge.

L3-4: No impingement. Mild disc bulge. Did small perineural cyst on
the left, highly unlikely to cause symptoms.

L4-5: Mild left and borderline right subarticular lateral recess
stenosis due to disc bulge. Left foraminal annular tear.

L5-S1:  No impingement.  Mild disc bulge.
IMPRESSION: 1. Disc bulge at L4-5 causes mild left foraminal stenosis.

## 2016-06-07 IMAGING — MR MRI OF THE LEFT HIP WITHOUT CONTRAST
4 of 5 series · 27 of 40 positions shown · non-contrast
Comparison: None.

CLINICAL DATA: Left hip and leg pain for 1.5 years.

EXAM:
MR OF THE LEFT HIP WITHOUT CONTRAST
TECHNIQUE: Multiplanar, multisequence MR imaging was performed. No intravenous
contrast was administered.

[Series 3: T1 · axial · 4.5mm · 0.85mm/px · z∈[-87,+177]mm · 9 of 48 slices shown (1 of 2)]
[im 1/48]
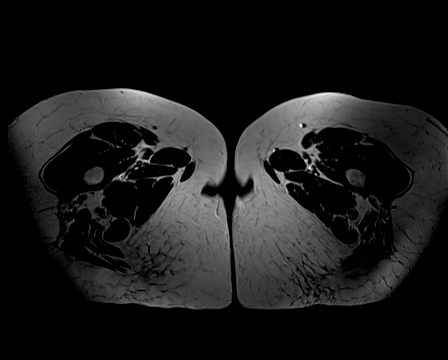
[im 6/48]
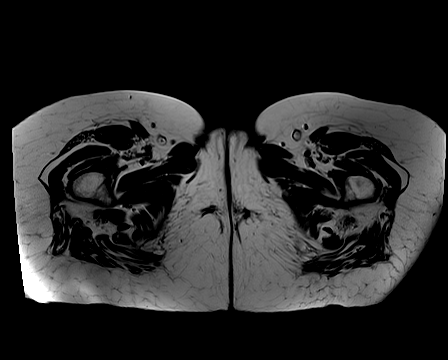
[im 12/48]
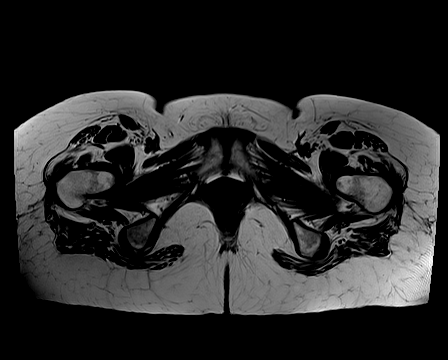
[im 18/48]
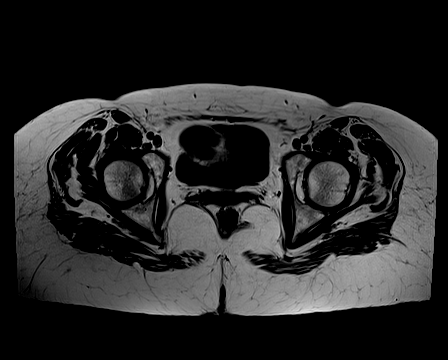
[im 24/48]
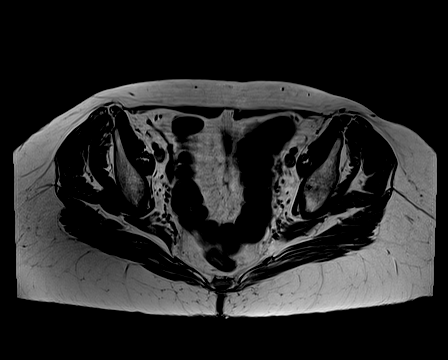
[im 30/48]
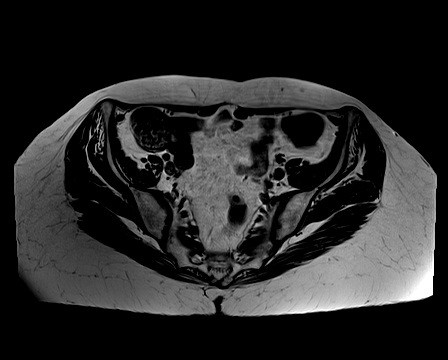
[im 36/48]
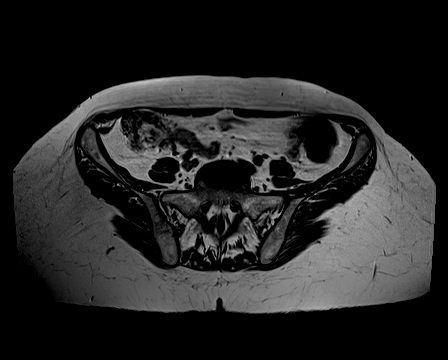
[im 42/48]
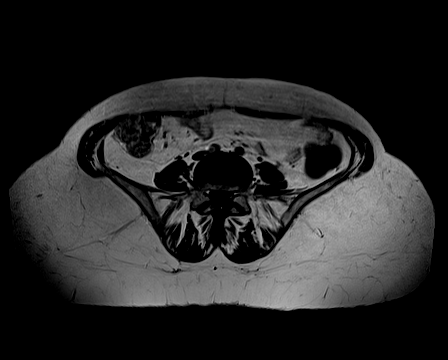
[im 48/48]
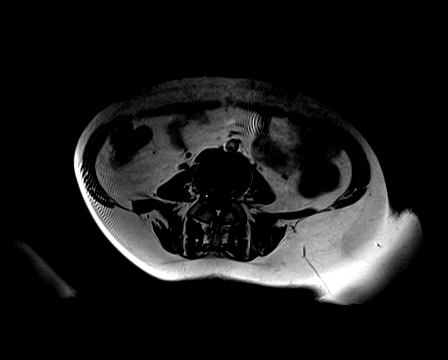

[Series 4: T2 fat-sat · axial · 4.5mm · 0.85mm/px · z∈[-86,+177]mm · 8 of 48 slices shown]
[im 1/48]
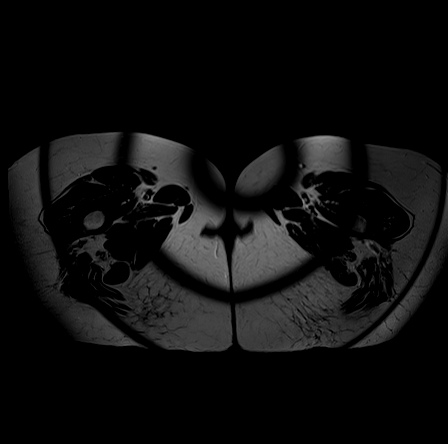
[im 6/48]
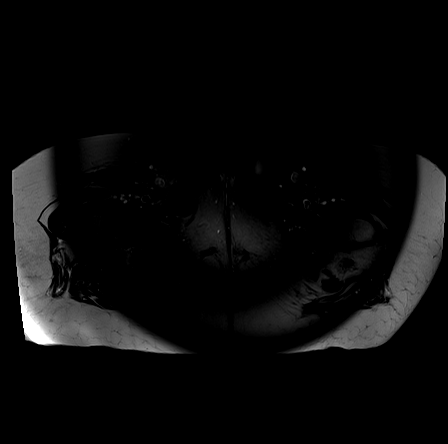
[im 16/48]
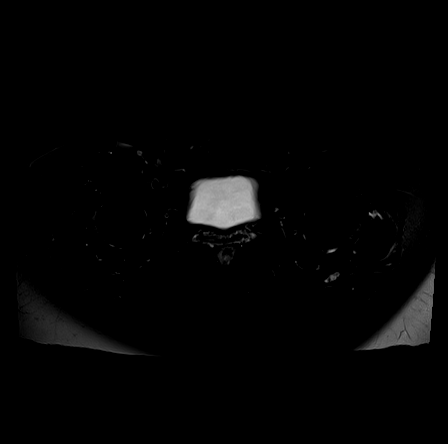
[im 21/48]
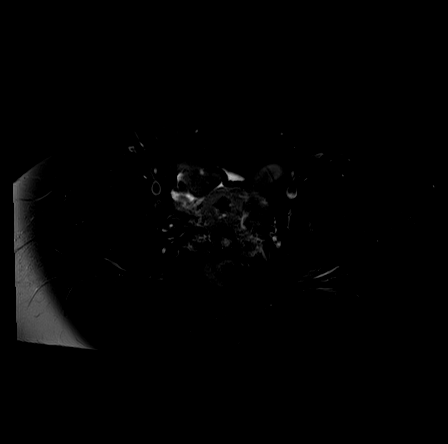
[im 27/48]
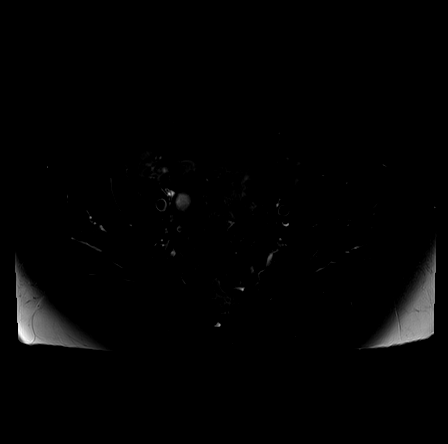
[im 32/48]
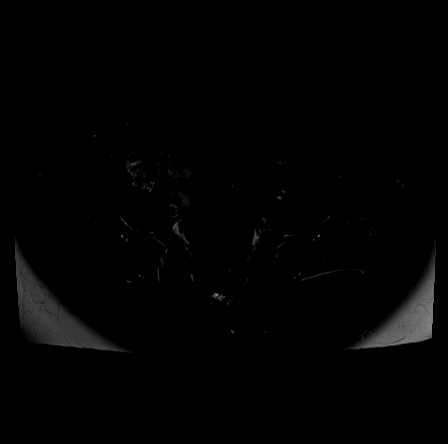
[im 42/48]
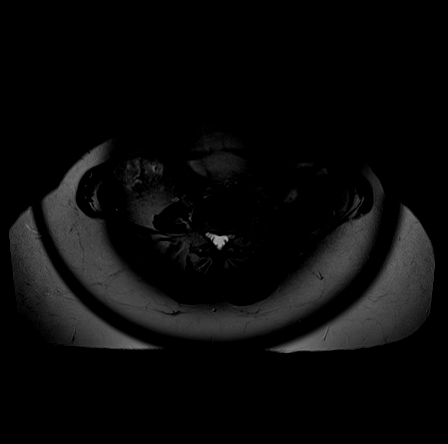
[im 48/48]
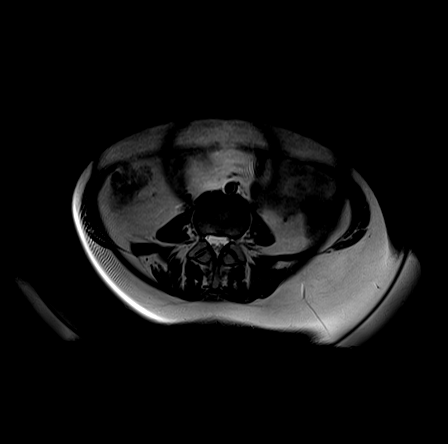

[Series 5: T1 · coronal · 4.0mm · 0.85mm/px · 5 of 36 slices shown (2 of 2)]
[im 1/36]
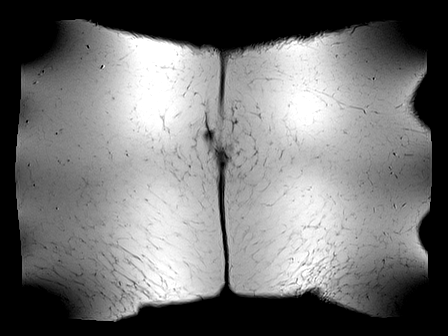
[im 6/36]
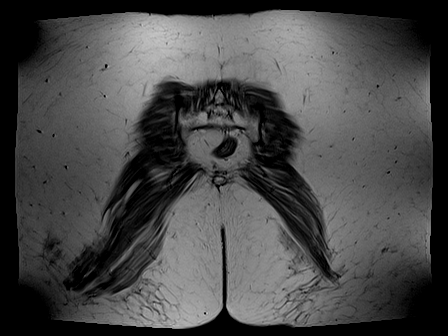
[im 11/36]
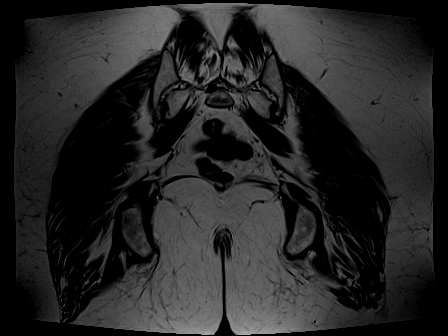
[im 21/36]
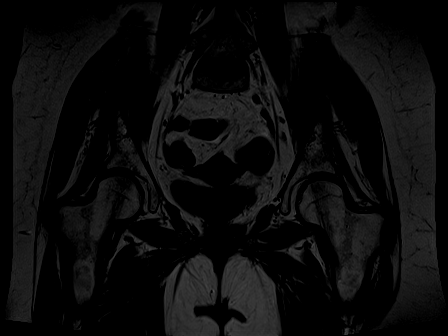
[im 31/36]
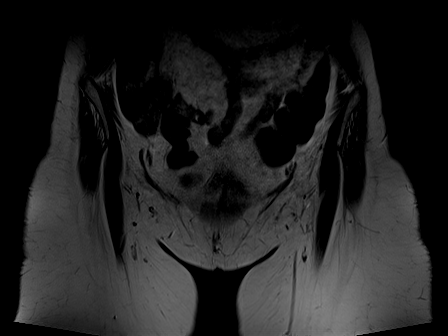

[Series 100: PD fat-sat · sagittal · 4.5mm · 0.27mm/px · 5 of 23 slices shown]
[im 1/23]
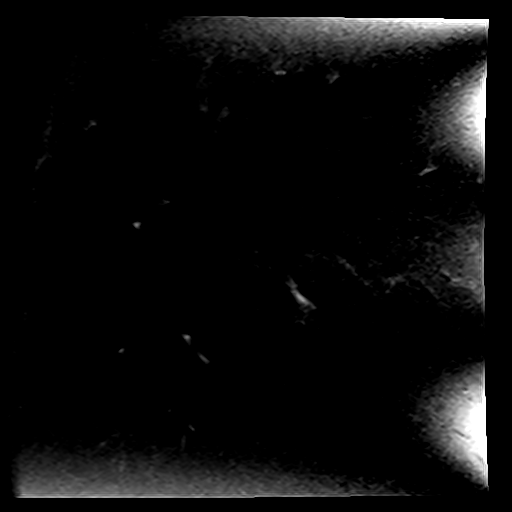
[im 6/23]
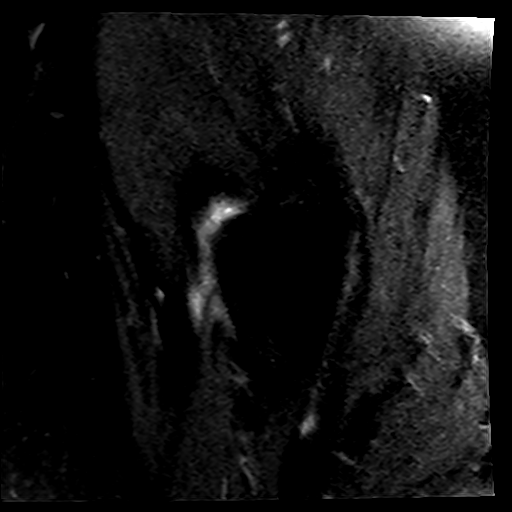
[im 12/23]
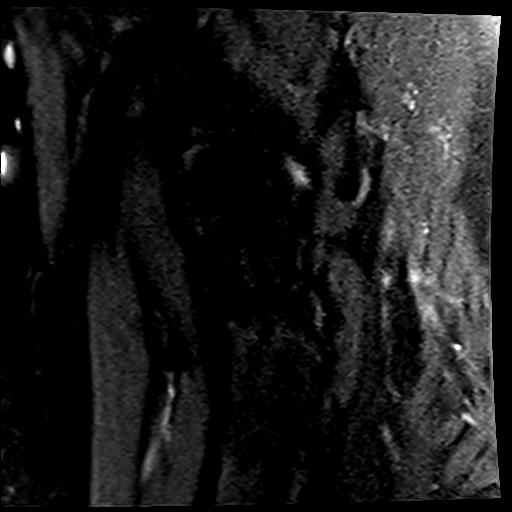
[im 17/23]
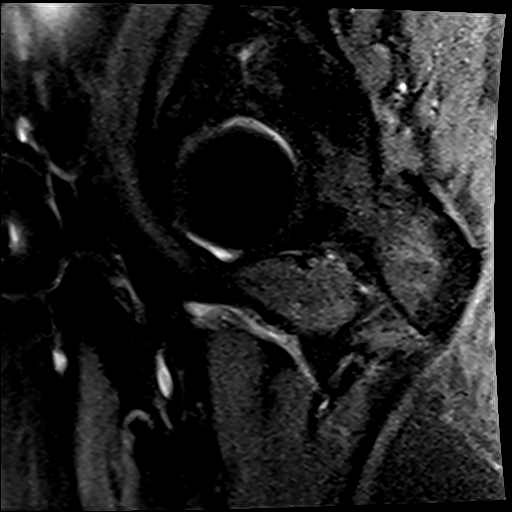
[im 23/23]
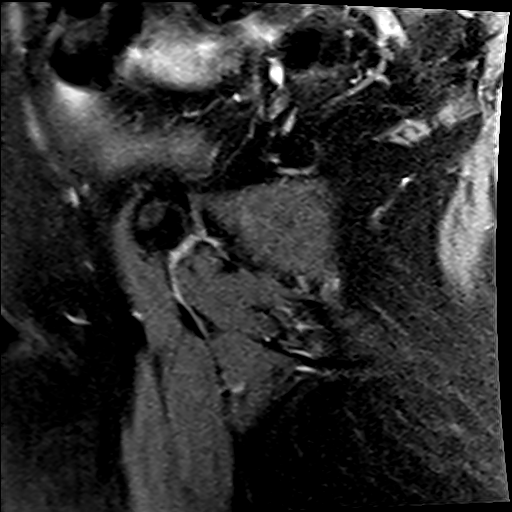

[27 of 40 positions shown; findings below may reference images not displayed]

FINDINGS: Bones: Degenerative subcortical acetabular cyst in the right
anterior superior acetabulum.

Articular cartilage and labrum

Articular cartilage: Low diagnostic sensitivity and specificity due
to motion artifact on the sagittal images.

Labrum: Possible small anterior superior labral tear on image 9 of
series 100, but very indistinct.

Joint or bursal effusion

Joint effusion:  Absent

Bursae:  Mild left trochanteric bursitis.

Muscles and tendons

Muscles and tendons:  Intact

Other findings

Miscellaneous: Low T2 and high T1 signal along the endometrium which
measures about 8 mm in thickness. Appearance suspicious for blood
products which would be unusual given the assumed postmenopausal
status of the patient. Lobular masslike 4.3 x 3.0 cm structure along
the anterior fundal margin of the uterus could be a subserosal
fundal fibroid but is not entirely specific.
IMPRESSION: 1. Potential blood products in the endometrial cavity. Appearance is
abnormal in the postmenopausal setting. Also there is a masslike
structure adjacent to the fundus of the uterus which could be a
subserosal fibroid but which requires further workup. Pelvic
sonography is recommended.
2. Mild left trochanteric bursitis.
3. Possible small anterior superior labral tear on the left, image 9
series 100, but very indistinct due to motion artifact.
4. Degenerative subcortical cysts in the right anterior superior
acetabulum.

## 2016-06-23 ENCOUNTER — Ambulatory Visit
Admission: RE | Admit: 2016-06-23 | Discharge: 2016-06-23 | Disposition: A | Discharge status: Home / Self Care | Payer: Medicare Other | Source: Ambulatory Visit | Attending: Family Medicine | Admitting: Family Medicine

## 2016-06-23 ENCOUNTER — Other Ambulatory Visit: Payer: Self-pay | Admitting: Family Medicine

## 2016-06-23 DIAGNOSIS — Z1231 Encounter for screening mammogram for malignant neoplasm of breast: Secondary | ICD-10-CM

## 2016-06-26 ENCOUNTER — Telehealth: Payer: Self-pay | Admitting: Obstetrics and Gynecology

## 2016-06-26 NOTE — Telephone Encounter (Signed)
PT HAS A QUESTION ABOUT HER MEDICATION

## 2016-06-26 NOTE — Telephone Encounter (Signed)
prempro pa faxed. Pt aware.

## 2016-06-27 ENCOUNTER — Telehealth: Payer: Self-pay | Admitting: *Deleted

## 2016-06-27 NOTE — Telephone Encounter (Signed)
Patient called and states that the insurance company denied Prempro and that she needs Dr Tennis Must to prescribe her something else . Patient is requesting a call back . Her number is [phone] or [phone]. Thanks

## 2016-07-08 ENCOUNTER — Telehealth: Payer: Self-pay | Admitting: Obstetrics and Gynecology

## 2016-07-08 MED ORDER — NORETHINDRONE-ETH ESTRADIOL 0.5-2.5 MG-MCG PO TABS: 1.0000 | ORAL_TABLET | Freq: Every day | ORAL | Status: DC

## 2016-07-08 NOTE — Telephone Encounter (Signed)
Changed to femhrt per mad- see previous message.

## 2016-07-08 NOTE — Telephone Encounter (Signed)
Pt aware per mad to switch to femhrt 0.5-2.5. Pt will contact me if insurance will not cover.

## 2016-07-08 NOTE — Telephone Encounter (Signed)
PT CALLED AND LM ON OFFICE VM STATING SHE IS TRYING TO REACH YOU AND WOULD LIKE A CALL BACK FROM YOU ABOUT A MEDICATION REFILL.

## 2016-07-10 ENCOUNTER — Telehealth: Payer: Self-pay | Admitting: Obstetrics and Gynecology

## 2016-07-10 NOTE — Telephone Encounter (Signed)
Pt aware pa faxed with confirmation.

## 2016-07-10 NOTE — Telephone Encounter (Signed)
Patient called stating she had been speaking with you previously regarding a script. She stated the problem was that it needed prior authorization and you can get that from [facility] at [phone]. She also stated the script can be filled with [facility] and not to worry about dealing with [facility]. Thanks

## 2017-03-03 ENCOUNTER — Ambulatory Visit (INDEPENDENT_AMBULATORY_CARE_PROVIDER_SITE_OTHER): Payer: Medicare Other | Admitting: Obstetrics and Gynecology

## 2017-03-03 ENCOUNTER — Encounter: Payer: Self-pay | Admitting: Obstetrics and Gynecology

## 2017-03-03 VITALS — BP 177/79 | HR 81 | Ht 63.0 in | Wt 123.3 lb

## 2017-03-03 DIAGNOSIS — Z1211 Encounter for screening for malignant neoplasm of colon: Secondary | ICD-10-CM

## 2017-03-03 DIAGNOSIS — E8940 Asymptomatic postprocedural ovarian failure: Secondary | ICD-10-CM

## 2017-03-03 DIAGNOSIS — Z90722 Acquired absence of ovaries, bilateral: Secondary | ICD-10-CM | POA: Diagnosis not present

## 2017-03-03 DIAGNOSIS — N952 Postmenopausal atrophic vaginitis: Secondary | ICD-10-CM | POA: Diagnosis not present

## 2017-03-03 DIAGNOSIS — Z9882 Breast implant status: Secondary | ICD-10-CM | POA: Diagnosis not present

## 2017-03-03 NOTE — Progress Notes (Signed)
Patient ID: Margaret Berg, female   DOB: May 05, 1951, 66 y.o.   MRN: [id number] ANNUAL PREVENTATIVE CARE GYN  ENCOUNTER NOTE  Subjective:       Margaret Berg is a 66 y.o. G15P2002 female here for a routine annual gynecologic exam.  Current complaints: 1.  Legs and back pain while trying to sleep   2.  Status post laparoscopic BSO and myomectomy, on HRT, asymptomatic  Not currently on HRT therapy; does have occasional vasomotor symptoms between 9 PM and 1 AM. Is not complaining of vaginal dryness. Discontinued HRT therapy in the past year because of insurance not covering the medication.  Patient has been dealing with the major stressful family issues with the death of her mom this year. She is the only child having to settle her state. She is committed to getting her health in order this year.   Gynecologic History No LMP recorded. Patient is postmenopausal. Contraception: post menopausal status Last Pap: 03/2014. Results were: normal Last mammogram: 06/23/2016 birad 1 thru pcp. Results were: normal Status post laparoscopic BSO and myomectomy. On Prempro, asymptomatic; patient desires to continue  Obstetric History OB History  Gravida Para Term Preterm AB Living  2 2 2     2   SAB TAB Ectopic Multiple Live Births          2    # Outcome Date GA Lbr Len/2nd Weight Sex Delivery Anes PTL Lv  2 Term 1971   8 lb 2.2 oz (3.692 kg) M Vag-Spont   LIV  1 Term 1969     Vag-Spont   LIV      Past Medical History:  Diagnosis Date  . Anxiety   . Arthritis   . Bursitis of hip    left  . Cancer ([place])    basal cell  . Depression   . Dyspareunia, female   . HSV-2 (herpes simplex virus 2) infection   . Hypothyroidism   . Menopause   . Osteoporosis   . Ovarian cystic mass   . Uterine mass     Past Surgical History:  Procedure Laterality Date  . AUGMENTATION MAMMAPLASTY Bilateral [id number]   SILICONE  . BASAL CELL CARCINOMA EXCISION    . BILATERAL SALPINGOOPHORECTOMY    . BREAST  BIOPSY Right 2013   NEG  . COLONOSCOPY WITH PROPOFOL N/A 07/20/2015   Procedure: COLONOSCOPY WITH PROPOFOL;  Surgeon: Lollie Sails, MD;  Location[facility] ENDOSCOPY;  Service: Endoscopy;  Laterality: N/A;  . MYOMECTOMY    . OOPHORECTOMY Bilateral     Current Outpatient Prescriptions on File Prior to Visit  Medication Sig Dispense Refill  . albuterol (PROVENTIL HFA;VENTOLIN HFA) 108 (90 Base) MCG/ACT inhaler Inhale 1-2 puffs into the lungs every 6 (six) hours as needed for wheezing or shortness of breath. 1 Inhaler 0  . Calcium-Vitamin D 600-200 MG-UNIT tablet Take by mouth.    . cetirizine (ZYRTEC) 10 MG tablet Take 10 mg by mouth daily.    . chlorpheniramine-HYDROcodone (TUSSIONEX PENNKINETIC ER) 10-8 MG/5ML SUER Take 5 mLs by mouth every 12 (twelve) hours as needed. 140 mL 0  . Cholecalciferol (VITAMIN D3) 2000 units capsule Take by mouth.    . estrogen, conjugated,-medroxyprogesterone (PREMPRO) 0.3-1.5 MG tablet Take 1 tablet by mouth daily. 90 tablet 4  . levofloxacin (LEVAQUIN) 500 MG tablet Take 1 tablet (500 mg total) by mouth daily. 7 tablet 0  . LORazepam (ATIVAN) 0.5 MG tablet Take 0.5 mg by mouth at bedtime as needed for anxiety.    Marland Kitchen  norethindrone-ethinyl estradiol (FEMHRT LOW DOSE) 0.5-2.5 MG-MCG tablet Take 1 tablet by mouth daily. 30 tablet 6  . venlafaxine XR (EFFEXOR-XR) 150 MG 24 hr capsule Take 150 mg by mouth daily.     No current facility-administered medications on file prior to visit.     Allergies  Allergen Reactions  . Codeine Nausea And Vomiting    Social History   Social History  . Marital status: Married    Spouse name: N/A  . Number of children: N/A  . Years of education: N/A   Occupational History  . Not on file.   Social History Main Topics  . Smoking status: Never Smoker  . Smokeless tobacco: Not on file  . Alcohol use Yes     Comment: occas  . Drug use: No  . Sexual activity: Yes    Birth control/ protection: Surgical   Other Topics  Concern  . Not on file   Social History Narrative  . No narrative on file    Family History  Problem Relation Age of Onset  . Osteoporosis Maternal Grandmother   . Diabetes Maternal Grandfather   . Cancer Neg Hx   . Heart disease Neg Hx   . Breast cancer Neg Hx     The following portions of the patient's history were reviewed and updated as appropriate: allergies, current medications, past family history, past medical history, past social history, past surgical history and problem list.  Review of Systems Review of Systems  Constitutional: Negative.        Hot flashes between 9 PM and 1 AM nightly  HENT: Negative.   Eyes: Negative.   Respiratory: Negative.   Cardiovascular: Negative.   Gastrointestinal: Negative.   Genitourinary: Negative.        Nocturia 2  Musculoskeletal: Positive for joint pain.  Skin: Negative.   Neurological: Negative.   Endo/Heme/Allergies: Negative.   Psychiatric/Behavioral: Negative.        History of anxiety, stable      Objective:   BP (!) 177/79   Pulse 81   Ht 5\' 3"  (1.6 m)   Wt 123 lb 4.8 oz (55.9 kg)   BMI 21.84 kg/m  CONSTITUTIONAL: Well-developed, well-nourished female in no acute distress.  PSYCHIATRIC: Normal mood and affect. Normal behavior. Normal judgment and thought content. [place]: Alert and oriented to person, place, and time. Normal muscle tone coordination. No cranial nerve deficit noted. HENT:  Normocephalic, atraumatic EYES: Conjunctivae and EOM are normal.  NECK: Normal range of motion, supple, no masses.  Normal thyroid.  SKIN: Skin is warm and dry. No rash noted. Not diaphoretic. No erythema. No pallor. CARDIOVASCULAR: not examined RESPIRATORY: Not examined BREASTS: Symmetric in size. No masses, skin changes, nipple drainage, or lymphadenopathy. Breast implants present, firm ABDOMEN: Soft, normal bowel sounds, no distention noted.  No tenderness, rebound or guarding. No hernias BLADDER:  Normal PELVIC:  External Genitalia: Normal  BUS: Normal  Vagina: Moderate to severe atrophy  Cervix: Normal; no lesions; no cervical motion tenderness  Uterus: Normal; midplane, normal size and shape, mobile  Adnexa: Normal; nonpalpable and nontender  RV: External Exam NormaI, No Rectal Masses and Normal Sphincter tone, thin sphincter and rectovaginal septum  MUSCULOSKELETAL: Normal range of motion. No tenderness.  No cyanosis, clubbing, or edema.  2+ distal pulses. LYMPHATIC: No Axillary, Supraclavicular, or Inguinal Adenopathy.    Assessment:   Annual gynecologic examination 66 y.o. Contraception: post menopausal status Normal BMI Status post laparoscopic BSO and myomectomy 2016 Menopausal state, minimally symptomatic,  not on HRT Vaginal atrophy, moderate to severe  Plan:  Pap: Not needed Mammogram: thru pcp Stool Guaiac Testing:  Stool cards ordered Labs: thru pcp Routine preventative health maintenance measures emphasized: Exercise/Diet/Weight control, Tobacco Warnings and Alcohol/Substance use risks Continue with calcium and vitamin D supplementation Consider trial of Advil 600 mg daily at bedtime for leg pain at night Consider Miacalcin nasal spray for treatment of osteoporosis; declined at this time Return to [place], CMA  Brayton Mars, MD  Note: This dictation was prepared with Dragon dictation along with smaller phrase technology. Any transcriptional errors that result from this process are unintentional.

## 2017-03-03 NOTE — Patient Instructions (Signed)
1. Pap smear is not necessary this year 2. Mammogram is due in June 3. Stool guaiac cards are given for colon cancer screening 4. Screening labs are performed by primary care Dr. Ellison Hughs 5. Continue with healthy eating and exercise 6. Continue with calcium and vitamin D supplementation 7. Return in 1 year for annual exam   Health Maintenance for Postmenopausal Women Menopause is a normal process in which your reproductive ability comes to an end. This process happens gradually over a span of months to years, usually between the ages of 9 and 31. Menopause is complete when you have missed 12 consecutive menstrual periods. It is important to talk with your health care provider about some of the most common conditions that affect postmenopausal women, such as heart disease, cancer, and bone loss (osteoporosis). Adopting a healthy lifestyle and getting preventive care can help to promote your health and wellness. Those actions can also lower your chances of developing some of these common conditions. What should I know about menopause? During menopause, you may experience a number of symptoms, such as:  Moderate-to-severe hot flashes.  Night sweats.  Decrease in sex drive.  Mood swings.  Headaches.  Tiredness.  Irritability.  Memory problems.  Insomnia. Choosing to treat or not to treat menopausal changes is an individual decision that you make with your health care provider. What should I know about hormone replacement therapy and supplements? Hormone therapy products are effective for treating symptoms that are associated with menopause, such as hot flashes and night sweats. Hormone replacement carries certain risks, especially as you become older. If you are thinking about using estrogen or estrogen with progestin treatments, discuss the benefits and risks with your health care provider. What should I know about heart disease and stroke? Heart disease, heart attack, and stroke  become more likely as you age. This may be due, in part, to the hormonal changes that your body experiences during menopause. These can affect how your body processes dietary fats, triglycerides, and cholesterol. Heart attack and stroke are both medical emergencies. There are many things that you can do to help prevent heart disease and stroke:  Have your blood pressure checked at least every 1-2 years. High blood pressure causes heart disease and increases the risk of stroke.  If you are 66-62 years old, ask your health care provider if you should take aspirin to prevent a heart attack or a stroke.  Do not use any tobacco products, including cigarettes, chewing tobacco, or electronic cigarettes. If you need help quitting, ask your health care provider.  It is important to eat a healthy diet and maintain a healthy weight.  Be sure to include plenty of vegetables, fruits, low-fat dairy products, and lean protein.  Avoid eating foods that are high in solid fats, added sugars, or salt (sodium).  Get regular exercise. This is one of the most important things that you can do for your health.  Try to exercise for at least 150 minutes each week. The type of exercise that you do should increase your heart rate and make you sweat. This is known as moderate-intensity exercise.  Try to do strengthening exercises at least twice each week. Do these in addition to the moderate-intensity exercise.  Know your numbers.Ask your health care provider to check your cholesterol and your blood glucose. Continue to have your blood tested as directed by your health care provider. What should I know about cancer screening? There are several types of cancer. Take the following steps  to reduce your risk and to catch any cancer development as early as possible. Breast Cancer  Practice breast self-awareness.  This means understanding how your breasts normally appear and feel.  It also means doing regular breast  self-exams. Let your health care provider know about any changes, no matter how small.  If you are 66 or older, have a clinician do a breast exam (clinical breast exam or CBE) every year. Depending on your age, family history, and medical history, it may be recommended that you also have a yearly breast X-ray (mammogram).  If you have a family history of breast cancer, talk with your health care provider about genetic screening.  If you are at high risk for breast cancer, talk with your health care provider about having an MRI and a mammogram every year.  Breast cancer (BRCA) gene test is recommended for women who have family members with BRCA-related cancers. Results of the assessment will determine the need for genetic counseling and BRCA1 and for BRCA2 testing. BRCA-related cancers include these types:  Breast. This occurs in males or females.  Ovarian.  Tubal. This may also be called fallopian tube cancer.  Cancer of the abdominal or pelvic lining (peritoneal cancer).  Prostate.  Pancreatic. Cervical, Uterine, and Ovarian Cancer  Your health care provider may recommend that you be screened regularly for cancer of the pelvic organs. These include your ovaries, uterus, and vagina. This screening involves a pelvic exam, which includes checking for microscopic changes to the surface of your cervix (Pap test).  For women ages 21-65, health care providers may recommend a pelvic exam and a Pap test every three years. For women ages 92-65, they may recommend the Pap test and pelvic exam, combined with testing for human papilloma virus (HPV), every five years. Some types of HPV increase your risk of cervical cancer. Testing for HPV may also be done on women of any age who have unclear Pap test results.  Other health care providers may not recommend any screening for nonpregnant women who are considered low risk for pelvic cancer and have no symptoms. Ask your health care provider if a screening  pelvic exam is right for you.  If you have had past treatment for cervical cancer or a condition that could lead to cancer, you need Pap tests and screening for cancer for at least 20 years after your treatment. If Pap tests have been discontinued for you, your risk factors (such as having a new sexual partner) need to be reassessed to determine if you should start having screenings again. Some women have medical problems that increase the chance of getting cervical cancer. In these cases, your health care provider may recommend that you have screening and Pap tests more often.  If you have a family history of uterine cancer or ovarian cancer, talk with your health care provider about genetic screening.  If you have vaginal bleeding after reaching menopause, tell your health care provider.  There are currently no reliable tests available to screen for ovarian cancer. Lung Cancer  Lung cancer screening is recommended for adults 56-38 years old who are at high risk for lung cancer because of a history of smoking. A yearly low-dose CT scan of the lungs is recommended if you:  Currently smoke.  Have a history of at least 30 pack-years of smoking and you currently smoke or have quit within the past 15 years. A pack-year is smoking an average of one pack of cigarettes per day for one  year. Yearly screening should:  Continue until it has been 15 years since you quit.  Stop if you develop a health problem that would prevent you from having lung cancer treatment. Colorectal Cancer  This type of cancer can be detected and can often be prevented.  Routine colorectal cancer screening usually begins at age 61 and continues through age 24.  If you have risk factors for colon cancer, your health care provider may recommend that you be screened at an earlier age.  If you have a family history of colorectal cancer, talk with your health care provider about genetic screening.  Your health care provider  may also recommend using home test kits to check for hidden blood in your stool.  A small camera at the end of a tube can be used to examine your colon directly (sigmoidoscopy or colonoscopy). This is done to check for the earliest forms of colorectal cancer.  Direct examination of the colon should be repeated every 5-10 years until age 65. However, if early forms of precancerous polyps or small growths are found or if you have a family history or genetic risk for colorectal cancer, you may need to be screened more often. Skin Cancer  Check your skin from head to toe regularly.  Monitor any moles. Be sure to tell your health care provider:  About any new moles or changes in moles, especially if there is a change in a mole's shape or color.  If you have a mole that is larger than the size of a pencil eraser.  If any of your family members has a history of skin cancer, especially at a young age, talk with your health care provider about genetic screening.  Always use sunscreen. Apply sunscreen liberally and repeatedly throughout the day.  Whenever you are outside, protect yourself by wearing long sleeves, pants, a wide-brimmed hat, and sunglasses. What should I know about osteoporosis? Osteoporosis is a condition in which bone destruction happens more quickly than new bone creation. After menopause, you may be at an increased risk for osteoporosis. To help prevent osteoporosis or the bone fractures that can happen because of osteoporosis, the following is recommended:  If you are 25-25 years old, get at least 1,000 mg of calcium and at least 600 mg of vitamin D per day.  If you are older than age 39 but younger than age 41, get at least 1,200 mg of calcium and at least 600 mg of vitamin D per day.  If you are older than age 47, get at least 1,200 mg of calcium and at least 800 mg of vitamin D per day. Smoking and excessive alcohol intake increase the risk of osteoporosis. Eat foods that are  rich in calcium and vitamin D, and do weight-bearing exercises several times each week as directed by your health care provider. What should I know about how menopause affects my mental health? Depression may occur at any age, but it is more common as you become older. Common symptoms of depression include:  Low or sad mood.  Changes in sleep patterns.  Changes in appetite or eating patterns.  Feeling an overall lack of motivation or enjoyment of activities that you previously enjoyed.  Frequent crying spells. Talk with your health care provider if you think that you are experiencing depression. What should I know about immunizations? It is important that you get and maintain your immunizations. These include:  Tetanus, diphtheria, and pertussis (Tdap) booster vaccine.  Influenza every year before the  flu season begins.  Pneumonia vaccine.  Shingles vaccine. Your health care provider may also recommend other immunizations. This information is not intended to replace advice given to you by your health care provider. Make sure you discuss any questions you have with your health care provider. Document Released: 02/06/2006 Document Revised: 07/04/2016 Document Reviewed: 09/18/2015 Elsevier Interactive Patient Education  2017 [employer].

## 2017-03-16 ENCOUNTER — Encounter: Payer: Self-pay | Admitting: Obstetrics and Gynecology

## 2017-03-16 ENCOUNTER — Ambulatory Visit (INDEPENDENT_AMBULATORY_CARE_PROVIDER_SITE_OTHER): Payer: Medicare Other | Admitting: Obstetrics and Gynecology

## 2017-03-16 VITALS — BP 153/83 | HR 101 | Ht 63.0 in | Wt 122.4 lb

## 2017-03-16 DIAGNOSIS — N63 Unspecified lump in unspecified breast: Secondary | ICD-10-CM

## 2017-03-16 NOTE — Progress Notes (Signed)
HPI:      [name] is a 66 y.o. [phone] who LMP was No LMP recorded. Patient is postmenopausal.  Subjective:   She presents today After's discovering a right breast mass last week. She says it is nontender. She says she has no breast discharge. She has no family history of breast ovarian or colon cancer. She has previously had a right breast biopsy in a different area-benign. Of significant note she has bilateral breast implants.    Hx: The following portions of the patient's history were reviewed and updated as appropriate:              She  has a past medical history of Anxiety; Arthritis; Bursitis of hip; Cancer ([place]); Depression; Dyspareunia, female; HSV-2 (herpes simplex virus 2) infection; Hypothyroidism; Menopause; Osteoporosis; Ovarian cystic mass; and Uterine mass. She  does not have any pertinent problems on file. She  has a past surgical history that includes Colonoscopy with propofol (N/A, 07/20/2015); Bilateral salpingoophorectomy; Myomectomy; Excision basal cell carcinoma; Oophorectomy (Bilateral); Breast biopsy (Right, 2013); and Augmentation mammaplasty (Bilateral, 1979). Her family history includes Diabetes in her maternal grandfather; Osteoporosis in her maternal grandmother. She  reports that she has never smoked. She has never used smokeless tobacco. She reports that she drinks alcohol. She reports that she does not use drugs. Current Outpatient Prescriptions on File Prior to Visit  Medication Sig Dispense Refill  . Calcium-Vitamin D 600-200 MG-UNIT tablet Take by mouth.    . cetirizine (ZYRTEC) 10 MG tablet Take 10 mg by mouth daily.    . Cholecalciferol (VITAMIN D3) 2000 units capsule Take by mouth.    Marland Kitchen LORazepam (ATIVAN) 0.5 MG tablet Take 0.5 mg by mouth at bedtime as needed for anxiety.    Marland Kitchen venlafaxine (EFFEXOR) 37.5 MG tablet Take 37.5 mg by mouth 2 (two) times daily.    Marland Kitchen venlafaxine XR (EFFEXOR-XR) 150 MG 24 hr capsule Take 150 mg by mouth daily.     Marland Kitchen albuterol (PROVENTIL HFA;VENTOLIN HFA) 108 (90 Base) MCG/ACT inhaler Inhale 1-2 puffs into the lungs every 6 (six) hours as needed for wheezing or shortness of breath. (Patient not taking: Reported on 03/16/2017) 1 Inhaler 0   No current facility-administered medications on file prior to visit.          Review of Systems:  Review of Systems  Constitutional: Denied constitutional symptoms, night sweats, recent illness, fatigue, fever, insomnia and weight loss.  Eyes: Denied eye symptoms, eye pain, photophobia, vision change and visual disturbance.  Ears/Nose/Throat/Neck: Denied ear, nose, throat or neck symptoms, hearing loss, nasal discharge, sinus congestion and sore throat.  Cardiovascular: Denied cardiovascular symptoms, arrhythmia, chest pain/pressure, edema, exercise intolerance, orthopnea and palpitations.  Respiratory: Denied pulmonary symptoms, asthma, pleuritic pain, productive sputum, cough, dyspnea and wheezing.  Gastrointestinal: Denied, gastro-esophageal reflux, melena, nausea and vomiting.  Genitourinary: See HPI for additional information.  Musculoskeletal: Denied musculoskeletal symptoms, stiffness, swelling, muscle weakness and myalgia.  Dermatologic: Denied dermatology symptoms, rash and scar.  Neurologic: Denied neurology symptoms, dizziness, headache, neck pain and syncope.  Psychiatric: Denied psychiatric symptoms, anxiety and depression.  Endocrine: Denied endocrine symptoms including hot flashes and night sweats.   Meds:   Current Outpatient Prescriptions on File Prior to Visit  Medication Sig Dispense Refill  . Calcium-Vitamin D 600-200 MG-UNIT tablet Take by mouth.    . cetirizine (ZYRTEC) 10 MG tablet Take 10 mg by mouth daily.    . Cholecalciferol (VITAMIN D3) 2000 units capsule Take by mouth.    Marland Kitchen  LORazepam (ATIVAN) 0.5 MG tablet Take 0.5 mg by mouth at bedtime as needed for anxiety.    Marland Kitchen venlafaxine (EFFEXOR) 37.5 MG tablet Take 37.5 mg by mouth 2 (two)  times daily.    Marland Kitchen venlafaxine XR (EFFEXOR-XR) 150 MG 24 hr capsule Take 150 mg by mouth daily.    Marland Kitchen albuterol (PROVENTIL HFA;VENTOLIN HFA) 108 (90 Base) MCG/ACT inhaler Inhale 1-2 puffs into the lungs every 6 (six) hours as needed for wheezing or shortness of breath. (Patient not taking: Reported on 03/16/2017) 1 Inhaler 0   No current facility-administered medications on file prior to visit.     Objective:     Vitals:   03/16/17 1051  BP: (!) 153/83  Pulse: (!) 101              Examination was performed of both breasts. There is no nipple discharge. There is no axillary adenopathy. Bilateral implants are noted.  There is no skin changes or dimpling.  There is a right breast cystic mass approximately 3 cm in diameter at the 2 to 3:00 position. It is mobile. Fluctuant.  Assessment:    [id number] Patient Active Problem List   Diagnosis Date Noted  . Status post bilateral salpingo-oophorectomy (BSO) 03/03/2017  . Vaginal atrophy 03/03/2017  . H/O bilateral breast implants 03/03/2017  . Allergy to environmental factors 11/06/2014  . Degeneration of intervertebral disc of lumbar region 08/28/2014  . Nerve root inflammation 08/28/2014  . Anxiety 06/29/2014  . Clinical depression 06/29/2014  . Adult hypothyroidism 06/29/2014  . Surgical menopause 06/29/2014  . OP (osteoporosis) 06/29/2014  . Bursitis of hip 05/16/2014  . H/O malignant neoplasm of skin 08/26/2013  . H/O neoplasm 08/26/2013  . Basal cell carcinoma of face 08/24/2012     1. Breast mass in female     Palpates as a cystic mobile mass.    Plan:            1.  Mammogram/ultrasound. Orders Orders Placed This Encounter  Procedures  . US BREAST LTD UNI RIGHT INC AXILLA     Meds ordered this encounter  Medications  . EPINEPHrine 0.3 mg/0.3 mL IJ SOAJ injection    Sig: Inject into the muscle.        F/U  Return in about 3 weeks (around 04/06/2017).  Finis Bud, M.D. 03/16/2017 11:38 AM

## 2017-03-16 NOTE — Addendum Note (Signed)
Addended by: Elouise Munroe on: 03/16/2017 03:07 PM   Modules accepted: Orders

## 2017-03-23 ENCOUNTER — Ambulatory Visit
Admission: RE | Admit: 2017-03-23 | Discharge: 2017-03-23 | Disposition: A | Discharge status: Home / Self Care | Payer: Medicare Other | Source: Ambulatory Visit | Attending: Obstetrics and Gynecology | Admitting: Obstetrics and Gynecology

## 2017-03-23 DIAGNOSIS — N63 Unspecified lump in unspecified breast: Secondary | ICD-10-CM

## 2017-03-23 DIAGNOSIS — N6310 Unspecified lump in the right breast, unspecified quadrant: Secondary | ICD-10-CM | POA: Insufficient documentation

## 2017-03-23 DIAGNOSIS — N6001 Solitary cyst of right breast: Secondary | ICD-10-CM | POA: Insufficient documentation

## 2017-03-24 ENCOUNTER — Telehealth: Payer: Self-pay

## 2017-03-24 NOTE — Telephone Encounter (Signed)
-----   Message from Harlin Heys, MD sent at 03/24/2017  8:36 AM EDT ----- Likely benign fibrocystic change.  See report.

## 2017-03-24 NOTE — Telephone Encounter (Signed)
Pt aware.

## 2017-04-06 ENCOUNTER — Ambulatory Visit (INDEPENDENT_AMBULATORY_CARE_PROVIDER_SITE_OTHER): Payer: Medicare Other | Admitting: Obstetrics and Gynecology

## 2017-04-06 ENCOUNTER — Encounter: Payer: Self-pay | Admitting: Obstetrics and Gynecology

## 2017-04-06 VITALS — BP 124/74 | HR 99 | Ht 63.0 in | Wt 123.1 lb

## 2017-04-06 DIAGNOSIS — N63 Unspecified lump in unspecified breast: Secondary | ICD-10-CM

## 2017-04-06 NOTE — Progress Notes (Signed)
HPI:      [name] is a 66 y.o. [phone] who LMP was No LMP recorded. Patient is postmenopausal.  Subjective:   She presents today For follow-up of a breast cyst. She has undergone mammogram and ultrasound. She reports that it is still nontender reports no nipple discharge.    Hx: The following portions of the patient's history were reviewed and updated as appropriate:              She  has a past medical history of Anxiety; Arthritis; Bursitis of hip; Cancer ([place]); Depression; Dyspareunia, female; HSV-2 (herpes simplex virus 2) infection; Hypothyroidism; Menopause; Osteoporosis; Ovarian cystic mass; and Uterine mass. She  does not have any pertinent problems on file. Her family history includes Diabetes in her maternal grandfather; Osteoporosis in her maternal grandmother. Current Outpatient Prescriptions on File Prior to Visit  Medication Sig Dispense Refill  . albuterol (PROVENTIL HFA;VENTOLIN HFA) 108 (90 Base) MCG/ACT inhaler Inhale 1-2 puffs into the lungs every 6 (six) hours as needed for wheezing or shortness of breath. 1 Inhaler 0  . Calcium-Vitamin D 600-200 MG-UNIT tablet Take by mouth.    . cetirizine (ZYRTEC) 10 MG tablet Take 10 mg by mouth daily.    . Cholecalciferol (VITAMIN D3) 2000 units capsule Take by mouth.    . EPINEPHrine 0.3 mg/0.3 mL IJ SOAJ injection Inject into the muscle.    Marland Kitchen LORazepam (ATIVAN) 0.5 MG tablet Take 0.5 mg by mouth at bedtime as needed for anxiety.    Marland Kitchen venlafaxine (EFFEXOR) 37.5 MG tablet Take 37.5 mg by mouth 2 (two) times daily.    Marland Kitchen venlafaxine XR (EFFEXOR-XR) 150 MG 24 hr capsule Take 150 mg by mouth daily.     No current facility-administered medications on file prior to visit.          Review of Systems:  Review of Systems  Constitutional: Denied constitutional symptoms, night sweats, recent illness, fatigue, fever, insomnia and weight loss.  Eyes: Denied eye symptoms, eye pain, photophobia, vision change and visual  disturbance.  Ears/Nose/Throat/Neck: Denied ear, nose, throat or neck symptoms, hearing loss, nasal discharge, sinus congestion and sore throat.  Cardiovascular: Denied cardiovascular symptoms, arrhythmia, chest pain/pressure, edema, exercise intolerance, orthopnea and palpitations.  Respiratory: Denied pulmonary symptoms, asthma, pleuritic pain, productive sputum, cough, dyspnea and wheezing.  Gastrointestinal: Denied, gastro-esophageal reflux, melena, nausea and vomiting.  Genitourinary: See HPI for additional information.  Musculoskeletal: Denied musculoskeletal symptoms, stiffness, swelling, muscle weakness and myalgia.  Dermatologic: Denied dermatology symptoms, rash and scar.  Neurologic: Denied neurology symptoms, dizziness, headache, neck pain and syncope.  Psychiatric: Denied psychiatric symptoms, anxiety and depression.  Endocrine: Denied endocrine symptoms including hot flashes and night sweats.   Meds:   Current Outpatient Prescriptions on File Prior to Visit  Medication Sig Dispense Refill  . albuterol (PROVENTIL HFA;VENTOLIN HFA) 108 (90 Base) MCG/ACT inhaler Inhale 1-2 puffs into the lungs every 6 (six) hours as needed for wheezing or shortness of breath. 1 Inhaler 0  . Calcium-Vitamin D 600-200 MG-UNIT tablet Take by mouth.    . cetirizine (ZYRTEC) 10 MG tablet Take 10 mg by mouth daily.    . Cholecalciferol (VITAMIN D3) 2000 units capsule Take by mouth.    . EPINEPHrine 0.3 mg/0.3 mL IJ SOAJ injection Inject into the muscle.    Marland Kitchen LORazepam (ATIVAN) 0.5 MG tablet Take 0.5 mg by mouth at bedtime as needed for anxiety.    Marland Kitchen venlafaxine (EFFEXOR) 37.5 MG tablet Take 37.5 mg by  mouth 2 (two) times daily.    Marland Kitchen venlafaxine XR (EFFEXOR-XR) 150 MG 24 hr capsule Take 150 mg by mouth daily.     No current facility-administered medications on file prior to visit.     Objective:     Vitals:   04/06/17 1332  BP: 124/74  Pulse: 99              X-ray and ultrasound findings  reviewed directly with the patient  Assessment:    G2P2002 Patient Active Problem List   Diagnosis Date Noted  . Status post bilateral salpingo-oophorectomy (BSO) 03/03/2017  . Vaginal atrophy 03/03/2017  . H/O bilateral breast implants 03/03/2017  . Allergy to environmental factors 11/06/2014  . Degeneration of intervertebral disc of lumbar region 08/28/2014  . Nerve root inflammation 08/28/2014  . Anxiety 06/29/2014  . Clinical depression 06/29/2014  . Adult hypothyroidism 06/29/2014  . Surgical menopause 06/29/2014  . OP (osteoporosis) 06/29/2014  . Bursitis of hip 05/16/2014  . H/O malignant neoplasm of skin 08/26/2013  . H/O neoplasm 08/26/2013  . Basal cell carcinoma of face 08/24/2012     1. Breast mass in female     Consistent with breast cyst. Has been present for several years without change according to radiology. Benign appearance.   Plan:            1.  Routine annual screening mammogram in June as directed. Patient to contact us if symptoms change.  2.  We have discussed routine medical care including mammograms, Pap smears, hormone use, bone health.        F/U  Return for Annual Physical. I spent 15 minutes with this patient of which greater than 50% was spent discussing her mammogram and ultrasound findings, as well as routine medical care as noted above. Follow-up for breast mass discussed.  Finis Bud, M.D. 04/06/2017 2:07 PM

## 2017-06-01 ENCOUNTER — Other Ambulatory Visit: Payer: Self-pay | Admitting: Family Medicine

## 2017-06-01 DIAGNOSIS — Z1239 Encounter for other screening for malignant neoplasm of breast: Secondary | ICD-10-CM

## 2017-06-25 LAB — FECAL OCCULT BLOOD, IMMUNOCHEMICAL: FECAL OCCULT BLD: NEGATIVE

## 2017-07-13 ENCOUNTER — Ambulatory Visit
Admission: RE | Admit: 2017-07-13 | Discharge: 2017-07-13 | Disposition: A | Discharge status: Home / Self Care | Payer: Medicare Other | Source: Ambulatory Visit | Attending: Family Medicine | Admitting: Family Medicine

## 2017-07-13 DIAGNOSIS — Z1231 Encounter for screening mammogram for malignant neoplasm of breast: Secondary | ICD-10-CM | POA: Diagnosis present

## 2017-07-13 DIAGNOSIS — Z1239 Encounter for other screening for malignant neoplasm of breast: Secondary | ICD-10-CM

## 2018-03-01 NOTE — Progress Notes (Signed)
Patient ID: Margaret Berg, female   DOB: 07-28-1951, 67 y.o.   MRN: [id number] ANNUAL PREVENTATIVE CARE GYN  ENCOUNTER NOTE  Subjective:       Margaret Berg is a 67 y.o. G11P2002 female here for a routine annual gynecologic exam.  Current complaints: 1.   Status post laparoscopic BSO and myomectomy, no longer on HRT due to lack of coverage-Medicare  Not currently on HRT therapy; does have occasional vasomotor symptoms between 9 PM and 1 AM. Is not complaining of vaginal dryness. Discontinued HRT therapy in the past year because of insurance not covering the medication.  Bowel function is normal. Bladder function is normal. Patient has a right breast cyst which is being followed.  Gynecologic History No LMP recorded. Patient is postmenopausal. Contraception: post menopausal status Last Pap: 03/2014. Results were: normal Last mammogram: 06/2017 birad 1 thru pcp. Results were: normal Status post laparoscopic BSO and myomectomy.  Obstetric History OB History  Gravida Para Term Preterm AB Living  2 2 2     2   SAB TAB Ectopic Multiple Live Births          2    # Outcome Date GA Lbr Len/2nd Weight Sex Delivery Anes PTL Lv  2 Term 1971   8 lb 2.2 oz (3.692 kg) M Vag-Spont   LIV  1 Term 1969     Vag-Spont   LIV      Past Medical History:  Diagnosis Date  . Anxiety   . Arthritis   . Bursitis of hip    left  . Cancer ([place])    basal cell  . Depression   . Dyspareunia, female   . HSV-2 (herpes simplex virus 2) infection   . Hypothyroidism   . Menopause   . Osteoporosis   . Ovarian cystic mass   . Uterine mass     Past Surgical History:  Procedure Laterality Date  . AUGMENTATION MAMMAPLASTY Bilateral 2094   SILICONE  . BASAL CELL CARCINOMA EXCISION    . BILATERAL SALPINGOOPHORECTOMY    . BREAST BIOPSY Right 2013   NEG  . COLONOSCOPY WITH PROPOFOL N/A 07/20/2015   Procedure: COLONOSCOPY WITH PROPOFOL;  Surgeon: Lollie Sails, MD;  Location: [facility] ENDOSCOPY;  Service:  Endoscopy;  Laterality: N/A;  . MYOMECTOMY    . OOPHORECTOMY Bilateral     Current Outpatient Medications on File Prior to Visit  Medication Sig Dispense Refill  . albuterol (PROVENTIL HFA;VENTOLIN HFA) 108 (90 Base) MCG/ACT inhaler Inhale 1-2 puffs into the lungs every 6 (six) hours as needed for wheezing or shortness of breath. 1 Inhaler 0  . Calcium-Vitamin D 600-200 MG-UNIT tablet Take by mouth.    . cetirizine (ZYRTEC) 10 MG tablet Take 10 mg by mouth daily.    . Cholecalciferol (VITAMIN D3) 2000 units capsule Take by mouth.    . EPINEPHrine 0.3 mg/0.3 mL IJ SOAJ injection Inject into the muscle.    Marland Kitchen LORazepam (ATIVAN) 0.5 MG tablet Take 0.5 mg by mouth at bedtime as needed for anxiety.    Marland Kitchen venlafaxine (EFFEXOR) 37.5 MG tablet Take 37.5 mg by mouth 2 (two) times daily.    Marland Kitchen venlafaxine XR (EFFEXOR-XR) 150 MG 24 hr capsule Take 150 mg by mouth daily.     No current facility-administered medications on file prior to visit.     Allergies  Allergen Reactions  . Codeine Nausea And Vomiting    Social History   Socioeconomic History  . Marital status: Married  Spouse name: Not on file  . Number of children: Not on file  . Years of education: Not on file  . Highest education level: Not on file  Social Needs  . Financial resource strain: Not on file  . Food insecurity - worry: Not on file  . Food insecurity - inability: Not on file  . Transportation needs - medical: Not on file  . Transportation needs - non-medical: Not on file  Occupational History  . Not on file  Tobacco Use  . Smoking status: Never Smoker  . Smokeless tobacco: Never Used  Substance and Sexual Activity  . Alcohol use: Yes    Comment: occas  . Drug use: No  . Sexual activity: Yes    Birth control/protection: Surgical  Other Topics Concern  . Not on file  Social History Narrative  . Not on file    Family History  Problem Relation Age of Onset  . Osteoporosis Maternal Grandmother   . Diabetes  Maternal Grandfather   . Cancer Neg Hx   . Heart disease Neg Hx   . Breast cancer Neg Hx   . Ovarian cancer Neg Hx     The following portions of the patient's history were reviewed and updated as appropriate: allergies, current medications, past family history, past medical history, past social history, past surgical history and problem list.  Review of Systems Review of Systems  Constitutional: Negative.        Hot flashes between 9 PM and 1 AM nightly  HENT: Negative.   Eyes: Negative.   Respiratory: Negative.   Cardiovascular: Negative.   Gastrointestinal: Negative.   Genitourinary: Negative.        Nocturia 2  Skin: Negative.   Neurological: Negative.   Endo/Heme/Allergies: Negative.   Psychiatric/Behavioral: Negative.        History of anxiety, stable      Objective:   BP (!) 146/76   Pulse 80   Ht 5\' 3"  (1.6 m)   Wt 135 lb 14.4 oz (61.6 kg)   BMI 24.07 kg/m  CONSTITUTIONAL: Well-developed, well-nourished female in no acute distress.  PSYCHIATRIC: Normal mood and affect. Normal behavior. Normal judgment and thought content. [name]: Alert and oriented to person, place, and time. Normal muscle tone coordination. No cranial nerve deficit noted. HENT:  Normocephalic, atraumatic EYES: Conjunctivae and EOM are normal.  NECK: Normal range of motion, supple, no masses.  Normal thyroid.  SKIN: Skin is warm and dry. No rash noted. Not diaphoretic. No erythema. No pallor. CARDIOVASCULAR: Regular rate and rhythm without murmur RESPIRATORY: Clear BREASTS: Symmetric in size. No masses, skin changes, nipple drainage, or lymphadenopathy. Breast implants present, firm; 2 x 2 centimeter breast cyst, right, nontender, mobile ABDOMEN: No distention noted.  No tenderness, rebound or guarding. No hernias BLADDER: Normal PELVIC:  External Genitalia: Normal  BUS: Normal  Vagina: Moderate to severe atrophy  Cervix: Normal; no lesions; no cervical motion tenderness  Uterus: Small;  midplane, normal size and shape, mobile  Adnexa: Normal; nonpalpable and nontender  RV: External Exam NormaI, No Rectal Masses and Normal Sphincter tone, thin sphincter and rectovaginal septum  MUSCULOSKELETAL: Normal range of motion. No tenderness.  No cyanosis, clubbing, or edema.  2+ distal pulses. LYMPHATIC: No Axillary, Supraclavicular, or Inguinal Adenopathy.    Assessment:   Annual gynecologic examination 67 y.o. Contraception: post menopausal status Normal BMI Status post laparoscopic BSO and myomectomy 2016 Menopausal state, minimally symptomatic, not on HRT Vaginal atrophy, moderate to severe  Plan:  Pap:  Not needed Mammogram: thru pcp Stool Guaiac Testing:  Stool cards ordered Labs: thru pcp Routine preventative health maintenance measures emphasized: Exercise/Diet/Weight control, Tobacco Warnings and Alcohol/Substance use risks Continue with calcium and vitamin D supplementation Trial of Premarin cream intravaginal twice a week Over-the-counter lubricants discussed Return to [place], CMA  Brayton Mars, MD   Note: This dictation was prepared with Dragon dictation along with smaller phrase technology. Any transcriptional errors that result from this process are unintentional.

## 2018-03-04 ENCOUNTER — Encounter: Payer: Self-pay | Admitting: Obstetrics and Gynecology

## 2018-03-04 ENCOUNTER — Ambulatory Visit (INDEPENDENT_AMBULATORY_CARE_PROVIDER_SITE_OTHER): Payer: Medicare Other | Admitting: Obstetrics and Gynecology

## 2018-03-04 VITALS — BP 146/76 | HR 80 | Ht 63.0 in | Wt 135.9 lb

## 2018-03-04 DIAGNOSIS — E8940 Asymptomatic postprocedural ovarian failure: Secondary | ICD-10-CM

## 2018-03-04 DIAGNOSIS — F419 Anxiety disorder, unspecified: Secondary | ICD-10-CM

## 2018-03-04 DIAGNOSIS — Z1211 Encounter for screening for malignant neoplasm of colon: Secondary | ICD-10-CM

## 2018-03-04 DIAGNOSIS — Z9882 Breast implant status: Secondary | ICD-10-CM | POA: Diagnosis not present

## 2018-03-04 DIAGNOSIS — N952 Postmenopausal atrophic vaginitis: Secondary | ICD-10-CM

## 2018-03-04 MED ORDER — CLONIDINE 0.1 MG/24HR TD PTWK: 0.1000 mg | MEDICATED_PATCH | TRANSDERMAL | 12 refills | Status: DC

## 2018-03-04 MED ORDER — ESTROGENS, CONJUGATED 0.625 MG/GM VA CREA: TOPICAL_CREAM | VAGINAL | 1 refills | Status: DC

## 2018-03-04 NOTE — Patient Instructions (Addendum)
1.  No Pap smear needed. 2.  Mammogram is to be obtained through primary care 3.  Stool guaiac card testing for colon cancer screening is ordered 4.  Screening labs are to be obtained through primary care 5.  Continue with healthy eating and exercise 6.  Continue with calcium and vitamin D supplementation for osteoporosis prevention 7.  Premarin cream 1/2 g intravaginal twice weekly 8.  Clonidine patches 0.1 mg weekly for hot flashes 9.  Over-the-counter lubricants include: Virgin olive oil Coconut oil Astroglide Jo H2O lubricant 10.  Return in 1 year Health Maintenance for Postmenopausal Women Menopause is a normal process in which your reproductive ability comes to an end. This process happens gradually over a span of months to years, usually between the ages of 37 and 69. Menopause is complete when you have missed 12 consecutive menstrual periods. It is important to talk with your health care provider about some of the most common conditions that affect postmenopausal women, such as heart disease, cancer, and bone loss (osteoporosis). Adopting a healthy lifestyle and getting preventive care can help to promote your health and wellness. Those actions can also lower your chances of developing some of these common conditions. What should I know about menopause? During menopause, you may experience a number of symptoms, such as:  Moderate-to-severe hot flashes.  Night sweats.  Decrease in sex drive.  Mood swings.  Headaches.  Tiredness.  Irritability.  Memory problems.  Insomnia.  Choosing to treat or not to treat menopausal changes is an individual decision that you make with your health care provider. What should I know about hormone replacement therapy and supplements? Hormone therapy products are effective for treating symptoms that are associated with menopause, such as hot flashes and night sweats. Hormone replacement carries certain risks, especially as you become older.  If you are thinking about using estrogen or estrogen with progestin treatments, discuss the benefits and risks with your health care provider. What should I know about heart disease and stroke? Heart disease, heart attack, and stroke become more likely as you age. This may be due, in part, to the hormonal changes that your body experiences during menopause. These can affect how your body processes dietary fats, triglycerides, and cholesterol. Heart attack and stroke are both medical emergencies. There are many things that you can do to help prevent heart disease and stroke:  Have your blood pressure checked at least every 1-2 years. High blood pressure causes heart disease and increases the risk of stroke.  If you are 69-42 years old, ask your health care provider if you should take aspirin to prevent a heart attack or a stroke.  Do not use any tobacco products, including cigarettes, chewing tobacco, or electronic cigarettes. If you need help quitting, ask your health care provider.  It is important to eat a healthy diet and maintain a healthy weight. ? Be sure to include plenty of vegetables, fruits, low-fat dairy products, and lean protein. ? Avoid eating foods that are high in solid fats, added sugars, or salt (sodium).  Get regular exercise. This is one of the most important things that you can do for your health. ? Try to exercise for at least 150 minutes each week. The type of exercise that you do should increase your heart rate and make you sweat. This is known as moderate-intensity exercise. ? Try to do strengthening exercises at least twice each week. Do these in addition to the moderate-intensity exercise.  Know your numbers.Ask your health  care provider to check your cholesterol and your blood glucose. Continue to have your blood tested as directed by your health care provider.  What should I know about cancer screening? There are several types of cancer. Take the following steps to  reduce your risk and to catch any cancer development as early as possible. Breast Cancer  Practice breast self-awareness. ? This means understanding how your breasts normally appear and feel. ? It also means doing regular breast self-exams. Let your health care provider know about any changes, no matter how small.  If you are 69 or older, have a clinician do a breast exam (clinical breast exam or CBE) every year. Depending on your age, family history, and medical history, it may be recommended that you also have a yearly breast X-ray (mammogram).  If you have a family history of breast cancer, talk with your health care provider about genetic screening.  If you are at high risk for breast cancer, talk with your health care provider about having an MRI and a mammogram every year.  Breast cancer (BRCA) gene test is recommended for women who have family members with BRCA-related cancers. Results of the assessment will determine the need for genetic counseling and BRCA1 and for BRCA2 testing. BRCA-related cancers include these types: ? Breast. This occurs in males or females. ? Ovarian. ? Tubal. This may also be called fallopian tube cancer. ? Cancer of the abdominal or pelvic lining (peritoneal cancer). ? Prostate. ? Pancreatic.  Cervical, Uterine, and Ovarian Cancer Your health care provider may recommend that you be screened regularly for cancer of the pelvic organs. These include your ovaries, uterus, and vagina. This screening involves a pelvic exam, which includes checking for microscopic changes to the surface of your cervix (Pap test).  For women ages 21-65, health care providers may recommend a pelvic exam and a Pap test every three years. For women ages 69-65, they may recommend the Pap test and pelvic exam, combined with testing for human papilloma virus (HPV), every five years. Some types of HPV increase your risk of cervical cancer. Testing for HPV may also be done on women of any  age who have unclear Pap test results.  Other health care providers may not recommend any screening for nonpregnant women who are considered low risk for pelvic cancer and have no symptoms. Ask your health care provider if a screening pelvic exam is right for you.  If you have had past treatment for cervical cancer or a condition that could lead to cancer, you need Pap tests and screening for cancer for at least 20 years after your treatment. If Pap tests have been discontinued for you, your risk factors (such as having a new sexual partner) need to be reassessed to determine if you should start having screenings again. Some women have medical problems that increase the chance of getting cervical cancer. In these cases, your health care provider may recommend that you have screening and Pap tests more often.  If you have a family history of uterine cancer or ovarian cancer, talk with your health care provider about genetic screening.  If you have vaginal bleeding after reaching menopause, tell your health care provider.  There are currently no reliable tests available to screen for ovarian cancer.  Lung Cancer Lung cancer screening is recommended for adults 82-3 years old who are at high risk for lung cancer because of a history of smoking. A yearly low-dose CT scan of the lungs is recommended if you:  Currently smoke.  Have a history of at least 30 pack-years of smoking and you currently smoke or have quit within the past 15 years. A pack-year is smoking an average of one pack of cigarettes per day for one year.  Yearly screening should:  Continue until it has been 15 years since you quit.  Stop if you develop a health problem that would prevent you from having lung cancer treatment.  Colorectal Cancer  This type of cancer can be detected and can often be prevented.  Routine colorectal cancer screening usually begins at age 80 and continues through age 67.  If you have risk factors  for colon cancer, your health care provider may recommend that you be screened at an earlier age.  If you have a family history of colorectal cancer, talk with your health care provider about genetic screening.  Your health care provider may also recommend using home test kits to check for hidden blood in your stool.  A small camera at the end of a tube can be used to examine your colon directly (sigmoidoscopy or colonoscopy). This is done to check for the earliest forms of colorectal cancer.  Direct examination of the colon should be repeated every 5-10 years until age 30. However, if early forms of precancerous polyps or small growths are found or if you have a family history or genetic risk for colorectal cancer, you may need to be screened more often.  Skin Cancer  Check your skin from head to toe regularly.  Monitor any moles. Be sure to tell your health care provider: ? About any new moles or changes in moles, especially if there is a change in a mole's shape or color. ? If you have a mole that is larger than the size of a pencil eraser.  If any of your family members has a history of skin cancer, especially at a young age, talk with your health care provider about genetic screening.  Always use sunscreen. Apply sunscreen liberally and repeatedly throughout the day.  Whenever you are outside, protect yourself by wearing long sleeves, pants, a wide-brimmed hat, and sunglasses.  What should I know about osteoporosis? Osteoporosis is a condition in which bone destruction happens more quickly than new bone creation. After menopause, you may be at an increased risk for osteoporosis. To help prevent osteoporosis or the bone fractures that can happen because of osteoporosis, the following is recommended:  If you are 59-56 years old, get at least 1,000 mg of calcium and at least 600 mg of vitamin D per day.  If you are older than age 68 but younger than age 63, get at least 1,200 mg of  calcium and at least 600 mg of vitamin D per day.  If you are older than age 76, get at least 1,200 mg of calcium and at least 800 mg of vitamin D per day.  Smoking and excessive alcohol intake increase the risk of osteoporosis. Eat foods that are rich in calcium and vitamin D, and do weight-bearing exercises several times each week as directed by your health care provider. What should I know about how menopause affects my mental health? Depression may occur at any age, but it is more common as you become older. Common symptoms of depression include:  Low or sad mood.  Changes in sleep patterns.  Changes in appetite or eating patterns.  Feeling an overall lack of motivation or enjoyment of activities that you previously enjoyed.  Frequent crying spells.  Talk with your health care provider if you think that you are experiencing depression. What should I know about immunizations? It is important that you get and maintain your immunizations. These include:  Tetanus, diphtheria, and pertussis (Tdap) booster vaccine.  Influenza every year before the flu season begins.  Pneumonia vaccine.  Shingles vaccine.  Your health care provider may also recommend other immunizations. This information is not intended to replace advice given to you by your health care provider. Make sure you discuss any questions you have with your health care provider. Document Released: 02/06/2006 Document Revised: 07/04/2016 Document Reviewed: 09/18/2015 Elsevier Interactive Patient Education  2018 [employer].

## 2018-03-30 LAB — FECAL OCCULT BLOOD, IMMUNOCHEMICAL: FECAL OCCULT BLD: NEGATIVE

## 2018-06-01 ENCOUNTER — Other Ambulatory Visit: Payer: Self-pay | Admitting: Family Medicine

## 2018-06-01 DIAGNOSIS — Z1231 Encounter for screening mammogram for malignant neoplasm of breast: Secondary | ICD-10-CM

## 2018-07-16 ENCOUNTER — Ambulatory Visit
Admission: RE | Admit: 2018-07-16 | Discharge: 2018-07-16 | Disposition: A | Discharge status: Home / Self Care | Payer: Medicare Other | Source: Ambulatory Visit | Attending: Family Medicine | Admitting: Family Medicine

## 2018-07-16 DIAGNOSIS — Z1231 Encounter for screening mammogram for malignant neoplasm of breast: Secondary | ICD-10-CM

## 2019-03-08 ENCOUNTER — Other Ambulatory Visit (HOSPITAL_COMMUNITY)
Admission: RE | Admit: 2019-03-08 | Discharge: 2019-03-08 | Disposition: A | Discharge status: Home / Self Care | Payer: Medicare Other | Source: Ambulatory Visit | Attending: Obstetrics and Gynecology | Admitting: Obstetrics and Gynecology

## 2019-03-08 ENCOUNTER — Ambulatory Visit: Payer: Medicare Other | Admitting: Obstetrics and Gynecology

## 2019-03-08 ENCOUNTER — Encounter: Payer: Self-pay | Admitting: Obstetrics and Gynecology

## 2019-03-08 VITALS — BP 138/81 | HR 85 | Ht 63.0 in | Wt 134.9 lb

## 2019-03-08 DIAGNOSIS — Z780 Asymptomatic menopausal state: Secondary | ICD-10-CM | POA: Insufficient documentation

## 2019-03-08 DIAGNOSIS — Z01419 Encounter for gynecological examination (general) (routine) without abnormal findings: Secondary | ICD-10-CM | POA: Diagnosis not present

## 2019-03-08 DIAGNOSIS — N951 Menopausal and female climacteric states: Secondary | ICD-10-CM | POA: Diagnosis not present

## 2019-03-08 DIAGNOSIS — Z124 Encounter for screening for malignant neoplasm of cervix: Secondary | ICD-10-CM | POA: Diagnosis present

## 2019-03-08 NOTE — Progress Notes (Signed)
Patient comes in today for her annual exam. Patient is due for Pap test today. Patient has colonoscopy scheduled next year.

## 2019-03-08 NOTE — Progress Notes (Signed)
HPI:      [name] is a 68 y.o. [phone] who LMP was No LMP recorded. Patient is postmenopausal.  Subjective:   She presents today for her annual examination.  She is up-to-date on her mammograms and receives her general medical care from her family doctor.  She does complain of occasional hot flashes and says these have been no better throughout menopause.  She also complains of occasional vaginal dryness and some irritation with intercourse.  She knows this is from vaginal atrophy but finds the estrogen vaginal cream too expensive to use.  She does use vaginal lubricants successfully. Patient states that the lump in her breast has not changed since her last visit and is been the same for 3 years.  Mammography and ultrasonography show no worrisome findings.    Hx: The following portions of the patient's history were reviewed and updated as appropriate:             She  has a past medical history of Anxiety, Arthritis, Bursitis of hip, Cancer ([place]), Depression, Dyspareunia, female, HSV-2 (herpes simplex virus 2) infection, Hypothyroidism, Menopause, Osteoporosis, Ovarian cystic mass, and Uterine mass. She does not have any pertinent problems on file. She  has a past surgical history that includes Colonoscopy with propofol (N/A, 07/20/2015); Bilateral salpingoophorectomy; Myomectomy; Excision basal cell carcinoma; Oophorectomy (Bilateral); Augmentation mammaplasty (Bilateral, 1979); and Breast biopsy (Right, 2013). Her family history includes Diabetes in her maternal grandfather; Osteoporosis in her maternal grandmother. She  reports that she has never smoked. She has never used smokeless tobacco. She reports current alcohol use. She reports that she does not use drugs. She has a current medication list which includes the following prescription(s): calcium-vitamin d, vitamin d3, epinephrine, glucosamine-chondroitin, lorazepam, valacyclovir, and venlafaxine xr. She is allergic to codeine.     Review of Systems:  Review of Systems  Constitutional: Denied constitutional symptoms, night sweats, recent illness, fatigue, fever, insomnia and weight loss.  Eyes: Denied eye symptoms, eye pain, photophobia, vision change and visual disturbance.  Ears/Nose/Throat/Neck: Denied ear, nose, throat or neck symptoms, hearing loss, nasal discharge, sinus congestion and sore throat.  Cardiovascular: Denied cardiovascular symptoms, arrhythmia, chest pain/pressure, edema, exercise intolerance, orthopnea and palpitations.  Respiratory: Denied pulmonary symptoms, asthma, pleuritic pain, productive sputum, cough, dyspnea and wheezing.  Gastrointestinal: Denied, gastro-esophageal reflux, melena, nausea and vomiting.  Genitourinary: Denied genitourinary symptoms including symptomatic vaginal discharge, pelvic relaxation issues, and urinary complaints.  Musculoskeletal: Denied musculoskeletal symptoms, stiffness, swelling, muscle weakness and myalgia.  Dermatologic: Denied dermatology symptoms, rash and scar.  Neurologic: Denied neurology symptoms, dizziness, headache, neck pain and syncope.  Psychiatric: Denied psychiatric symptoms, anxiety and depression.  Endocrine: Denied endocrine symptoms including hot flashes and night sweats.   Meds:   Current Outpatient Medications on File Prior to Visit  Medication Sig Dispense Refill  . Calcium-Vitamin D 600-200 MG-UNIT tablet Take by mouth.    . Cholecalciferol (VITAMIN D3) 2000 units capsule Take by mouth.    . EPINEPHrine 0.3 mg/0.3 mL IJ SOAJ injection Inject into the muscle.    Marland Kitchen glucosamine-chondroitin 500-400 MG tablet Take 1 tablet by mouth 3 (three) times daily.    Marland Kitchen LORazepam (ATIVAN) 0.5 MG tablet Take 0.5 mg by mouth at bedtime as needed for anxiety.    . valACYclovir (VALTREX) 1000 MG tablet Take by mouth.    . venlafaxine XR (EFFEXOR-XR) 75 MG 24 hr capsule Take by mouth.     No current facility-administered medications on file prior to  visit.     Objective:     Vitals:   03/08/19 1336  BP: 138/81  Pulse: 85              Physical examination General NAD, Conversant  HEENT Atraumatic; Op clear with mmm.  Normo-cephalic. Pupils reactive. Anicteric sclerae  Thyroid/Neck Smooth without nodularity or enlargement. Normal ROM.  Neck Supple.  Skin No rashes, lesions or ulceration. Normal palpated skin turgor. No nodularity.  Breasts: No masses or discharge.  Symmetric.  No axillary adenopathy.  Bilateral implants.  There is a palpable cystic mass superficial on the right breast unchanged from last visit.  Lungs: Clear to auscultation.No rales or wheezes. Normal Respiratory effort, no retractions.  Heart: NSR.  No murmurs or rubs appreciated. No periferal edema  Abdomen: Soft.  Non-tender.  No masses.  No HSM. No hernia  Extremities: Moves all appropriately.  Normal ROM for age. No lymphadenopathy.  Neuro: Oriented to PPT.  Normal mood. Normal affect.     Pelvic:   Vulva: Normal appearance.  No lesions.  Vagina: No lesions or abnormalities noted.  Vaginal atrophy  Support: Normal pelvic support.  Urethra No masses tenderness or scarring.  Meatus Normal size without lesions or prolapse.  Cervix: Normal appearance.  No lesions.  Anus: Normal exam.  No lesions.  Perineum: Normal exam.  No lesions.        Bimanual   Uterus: Normal size.  Non-tender.  Mobile.  AV.  Adnexae: No masses.  Non-tender to palpation.  Cul-de-sac: Negative for abnormality.      Assessment:    [id number] Patient Active Problem List   Diagnosis Date Noted  . Status post bilateral salpingo-oophorectomy (BSO) 03/03/2017  . Vaginal atrophy 03/03/2017  . H/O bilateral breast implants 03/03/2017  . Allergy to environmental factors 11/06/2014  . Degeneration of intervertebral disc of lumbar region 08/28/2014  . Nerve root inflammation 08/28/2014  . Anxiety 06/29/2014  . Clinical depression 06/29/2014  . Adult hypothyroidism 06/29/2014  .  Surgical menopause 06/29/2014  . OP (osteoporosis) 06/29/2014  . Bursitis of hip 05/16/2014  . H/O malignant neoplasm of skin 08/26/2013  . H/O neoplasm 08/26/2013  . Basal cell carcinoma of face 08/24/2012     1. Well woman exam   2. Cervical cancer screening   3. Symptomatic menopausal or female climacteric states     Some vaginal atrophy causing issues with intercourse.  Occasional hot flashes.   Plan:            1.  Pap performed Basic Screening Recommendations The basic screening recommendations for asymptomatic women were discussed with the patient during her visit.  The age-appropriate recommendations were discussed with her and the rational for the tests reviewed.  When I am informed by the patient that another primary care physician has previously obtained the age-appropriate tests and they are up-to-date, only outstanding tests are ordered and referrals given as necessary.  Abnormal results of tests will be discussed with her when all of her results are completed.  2.  We have discussed estrogen vaginal cream in detail.  Good Rx discussed and patient will consider estrogen vaginal cream if she finds she can afford it.  3.  She was inquiring about restarting oral HRT but she has now been off of it more than 7 years and I have discouraged this because of the increase in heart disease and stroke risk when restarting. Orders No orders of the defined types were placed in this encounter.   No  orders of the defined types were placed in this encounter.       F/U  Return in about 1 year (around 03/07/2020) for Annual Physical.  Finis Bud, M.D. 03/08/2019 2:50 PM

## 2019-03-11 LAB — CYTOLOGY - PAP
DIAGNOSIS: NEGATIVE
HPV: NOT DETECTED

## 2019-06-15 ENCOUNTER — Other Ambulatory Visit: Payer: Self-pay | Admitting: Family Medicine

## 2019-06-15 DIAGNOSIS — Z1231 Encounter for screening mammogram for malignant neoplasm of breast: Secondary | ICD-10-CM

## 2019-07-22 ENCOUNTER — Ambulatory Visit
Admission: RE | Admit: 2019-07-22 | Discharge: 2019-07-22 | Disposition: A | Discharge status: Home / Self Care | Payer: Medicare Other | Source: Ambulatory Visit | Attending: Family Medicine | Admitting: Family Medicine

## 2019-07-22 DIAGNOSIS — Z1231 Encounter for screening mammogram for malignant neoplasm of breast: Secondary | ICD-10-CM | POA: Diagnosis not present

## 2020-03-08 ENCOUNTER — Encounter: Payer: Self-pay | Admitting: Obstetrics and Gynecology

## 2020-03-08 ENCOUNTER — Other Ambulatory Visit: Payer: Self-pay

## 2020-03-08 ENCOUNTER — Encounter: Payer: Medicare Other | Admitting: Obstetrics and Gynecology

## 2020-03-08 ENCOUNTER — Ambulatory Visit (INDEPENDENT_AMBULATORY_CARE_PROVIDER_SITE_OTHER): Payer: Medicare Other | Admitting: Obstetrics and Gynecology

## 2020-03-08 VITALS — BP 149/82 | HR 94 | Ht 63.0 in | Wt 140.0 lb

## 2020-03-08 DIAGNOSIS — Z01419 Encounter for gynecological examination (general) (routine) without abnormal findings: Secondary | ICD-10-CM

## 2020-03-08 NOTE — Progress Notes (Signed)
HPI:      [name] is a 69 y.o. [phone] who LMP was No LMP recorded. Patient is postmenopausal.  Subjective:   She presents today for her annual examination.  She has no complaints.  She has received her Covid shots. She still says her right breast lump is there and relatively unchanged.  This has been followed in several years by mammography.  She receives her mammogram and lab work through her PCP.  Her Pap last year was normal.    Hx: The following portions of the patient's history were reviewed and updated as appropriate:             She  has a past medical history of Anxiety, Arthritis, Bursitis of hip, Cancer ([facility]), Depression, Dyspareunia, female, HSV-2 (herpes simplex virus 2) infection, Hypothyroidism, Menopause, Osteoporosis, Ovarian cystic mass, and Uterine mass. She does not have any pertinent problems on file. She  has a past surgical history that includes Colonoscopy with propofol (N/A, 07/20/2015); Bilateral salpingoophorectomy; Myomectomy; Excision basal cell carcinoma; Oophorectomy (Bilateral); Augmentation mammaplasty (Bilateral, 1979); Breast biopsy (Right, 2013); and Upper gi endoscopy. Her family history includes Diabetes in her maternal grandfather; Osteoporosis in her maternal grandmother. She  reports that she has never smoked. She has never used smokeless tobacco. She reports current alcohol use. She reports that she does not use drugs. She has a current medication list which includes the following prescription(s): calcium-vitamin d, vitamin d3, epinephrine, glucosamine-chondroitin, lorazepam, valacyclovir, and venlafaxine. She is allergic to codeine.       Review of Systems:  Review of Systems  Constitutional: Denied constitutional symptoms, night sweats, recent illness, fatigue, fever, insomnia and weight loss.  Eyes: Denied eye symptoms, eye pain, photophobia, vision change and visual disturbance.  Ears/Nose/Throat/Neck: Denied ear, nose, throat or neck  symptoms, hearing loss, nasal discharge, sinus congestion and sore throat.  Cardiovascular: Denied cardiovascular symptoms, arrhythmia, chest pain/pressure, edema, exercise intolerance, orthopnea and palpitations.  Respiratory: Denied pulmonary symptoms, asthma, pleuritic pain, productive sputum, cough, dyspnea and wheezing.  Gastrointestinal: Denied, gastro-esophageal reflux, melena, nausea and vomiting.  Genitourinary: Denied genitourinary symptoms including symptomatic vaginal discharge, pelvic relaxation issues, and urinary complaints.  Musculoskeletal: Denied musculoskeletal symptoms, stiffness, swelling, muscle weakness and myalgia.  Dermatologic: Denied dermatology symptoms, rash and scar.  Neurologic: Denied neurology symptoms, dizziness, headache, neck pain and syncope.  Psychiatric: Denied psychiatric symptoms, anxiety and depression.  Endocrine: Denied endocrine symptoms including hot flashes and night sweats.   Meds:   Current Outpatient Medications on File Prior to Visit  Medication Sig Dispense Refill  . Calcium-Vitamin D 600-200 MG-UNIT tablet Take by mouth.    . Cholecalciferol (VITAMIN D3) 2000 units capsule Take by mouth.    . EPINEPHrine 0.3 mg/0.3 mL IJ SOAJ injection Inject into the muscle.    Marland Kitchen glucosamine-chondroitin 500-400 MG tablet Take 1 tablet by mouth 3 (three) times daily.    Marland Kitchen LORazepam (ATIVAN) 0.5 MG tablet Take 0.5 mg by mouth at bedtime as needed for anxiety.    . valACYclovir (VALTREX) 1000 MG tablet Take by mouth.    . venlafaxine (EFFEXOR) 75 MG tablet      No current facility-administered medications on file prior to visit.    Objective:     Vitals:   03/08/20 1101  BP: (!) 149/82  Pulse: 94              Physical examination General NAD, Conversant  HEENT Atraumatic; Op clear with mmm.  Normo-cephalic. Pupils reactive. Anicteric sclerae  Thyroid/Neck Smooth without nodularity or enlargement. Normal ROM.  Neck Supple.  Skin No rashes,  lesions or ulceration. Normal palpated skin turgor. No nodularity.  Breasts: No masses or discharge.  Symmetric.  No axillary adenopathy.  Bilateral implants.   right breast with superficial cystic type palpable mass approximately 3:00.  [place]ong 2 and half centimeters in length by 1 cm in width.  Superficial to breast implant.  Lungs: Clear to auscultation.No rales or wheezes. Normal Respiratory effort, no retractions.  Heart: NSR.  No murmurs or rubs appreciated. No periferal edema  Abdomen: Soft.  Non-tender.  No masses.  No HSM. No hernia  Extremities: Moves all appropriately.  Normal ROM for age. No lymphadenopathy.  Neuro: Oriented to PPT.  Normal mood. Normal affect.     Pelvic:   Vulva: Normal appearance.  No lesions.  Vagina: No lesions or abnormalities noted.  Moderate vaginal atrophy  Support:  Second-degree cystocele  Urethra No masses tenderness or scarring.  Meatus Normal size without lesions or prolapse.  Cervix: Normal appearance.  No lesions.  Anus: Normal exam.  No lesions.  Perineum: Normal exam.  No lesions.        Bimanual   Uterus: Normal size.  Non-tender.  Mobile.  AV.  Adnexae: No masses.  Non-tender to palpation.  Cul-de-sac: Negative for abnormality.      Assessment:    [id number] Patient Active Problem List   Diagnosis Date Noted  . Status post bilateral salpingo-oophorectomy (BSO) 03/03/2017  . Vaginal atrophy 03/03/2017  . H/O bilateral breast implants 03/03/2017  . Allergy to environmental factors 11/06/2014  . Degeneration of intervertebral disc of lumbar region 08/28/2014  . Nerve root inflammation 08/28/2014  . Anxiety 06/29/2014  . Clinical depression 06/29/2014  . Adult hypothyroidism 06/29/2014  . Surgical menopause 06/29/2014  . OP (osteoporosis) 06/29/2014  . Bursitis of hip 05/16/2014  . H/O malignant neoplasm of skin 08/26/2013  . H/O neoplasm 08/26/2013  . Basal cell carcinoma of face 08/24/2012     1. Well woman exam with routine  gynecological exam     Breast mass unchanged.   Plan:            1.  Basic Screening Recommendations The basic screening recommendations for asymptomatic women were discussed with the patient during her visit.  The age-appropriate recommendations were discussed with her and the rational for the tests reviewed.  When I am informed by the patient that another primary care physician has previously obtained the age-appropriate tests and they are up-to-date, only outstanding tests are ordered and referrals given as necessary.  Abnormal results of tests will be discussed with her when all of her results are completed.  Routine preventative health maintenance measures emphasized: Exercise/Diet/Weight control, Tobacco Warnings, Alcohol/Substance use risks and Stress Management 2.  Strongly recommend repeat mammography through PCP yearly.  Follow right breast mass. Orders No orders of the defined types were placed in this encounter.   No orders of the defined types were placed in this encounter.       F/U  Return in about 1 year (around 03/08/2021) for Annual Physical.  Finis Bud, M.D. 03/08/2020 11:37 AM

## 2020-06-22 ENCOUNTER — Other Ambulatory Visit: Payer: Self-pay | Admitting: Family Medicine

## 2020-06-22 DIAGNOSIS — Z1231 Encounter for screening mammogram for malignant neoplasm of breast: Secondary | ICD-10-CM

## 2020-07-25 ENCOUNTER — Other Ambulatory Visit: Payer: Self-pay | Admitting: Family Medicine

## 2020-07-25 ENCOUNTER — Ambulatory Visit
Admission: RE | Admit: 2020-07-25 | Discharge: 2020-07-25 | Disposition: A | Discharge status: Home / Self Care | Payer: Medicare Other | Source: Ambulatory Visit | Attending: Family Medicine | Admitting: Family Medicine

## 2020-07-25 DIAGNOSIS — Z1231 Encounter for screening mammogram for malignant neoplasm of breast: Secondary | ICD-10-CM | POA: Diagnosis not present

## 2020-08-01 ENCOUNTER — Other Ambulatory Visit: Payer: Self-pay

## 2020-08-01 ENCOUNTER — Other Ambulatory Visit
Admission: RE | Admit: 2020-08-01 | Discharge: 2020-08-01 | Disposition: A | Discharge status: Home / Self Care | Payer: Medicare Other | Source: Ambulatory Visit | Attending: Internal Medicine | Admitting: Internal Medicine

## 2020-08-01 DIAGNOSIS — Z01812 Encounter for preprocedural laboratory examination: Secondary | ICD-10-CM | POA: Diagnosis present

## 2020-08-01 DIAGNOSIS — Z20822 Contact with and (suspected) exposure to covid-19: Secondary | ICD-10-CM | POA: Diagnosis not present

## 2020-08-01 LAB — SARS CORONAVIRUS 2 (TAT 6-24 HRS): SARS Coronavirus 2: NEGATIVE

## 2020-08-02 ENCOUNTER — Encounter: Payer: Self-pay | Admitting: Internal Medicine

## 2020-08-03 ENCOUNTER — Encounter: Payer: Self-pay | Admitting: Internal Medicine

## 2020-08-03 ENCOUNTER — Ambulatory Visit: Payer: Medicare Other | Admitting: Anesthesiology

## 2020-08-03 ENCOUNTER — Ambulatory Visit
Admission: RE | Admit: 2020-08-03 | Discharge: 2020-08-03 | Disposition: A | Discharge status: Home / Self Care | Payer: Medicare Other | Attending: Internal Medicine | Admitting: Internal Medicine

## 2020-08-03 ENCOUNTER — Encounter
Admission: RE | Disposition: A | Discharge status: Home / Self Care | Payer: Self-pay | Source: Home / Self Care | Attending: Internal Medicine

## 2020-08-03 ENCOUNTER — Other Ambulatory Visit: Payer: Self-pay

## 2020-08-03 DIAGNOSIS — Z85828 Personal history of other malignant neoplasm of skin: Secondary | ICD-10-CM | POA: Insufficient documentation

## 2020-08-03 DIAGNOSIS — F329 Major depressive disorder, single episode, unspecified: Secondary | ICD-10-CM | POA: Diagnosis not present

## 2020-08-03 DIAGNOSIS — Z86010 Personal history of colonic polyps: Secondary | ICD-10-CM | POA: Diagnosis not present

## 2020-08-03 DIAGNOSIS — Z79899 Other long term (current) drug therapy: Secondary | ICD-10-CM | POA: Diagnosis not present

## 2020-08-03 DIAGNOSIS — K5730 Diverticulosis of large intestine without perforation or abscess without bleeding: Secondary | ICD-10-CM | POA: Insufficient documentation

## 2020-08-03 DIAGNOSIS — F419 Anxiety disorder, unspecified: Secondary | ICD-10-CM | POA: Diagnosis not present

## 2020-08-03 DIAGNOSIS — Z1211 Encounter for screening for malignant neoplasm of colon: Secondary | ICD-10-CM | POA: Insufficient documentation

## 2020-08-03 DIAGNOSIS — K640 First degree hemorrhoids: Secondary | ICD-10-CM | POA: Insufficient documentation

## 2020-08-03 HISTORY — PX: COLONOSCOPY WITH PROPOFOL: SHX5780

## 2020-08-03 SURGERY — COLONOSCOPY WITH PROPOFOL
Anesthesia: General

## 2020-08-03 MED ORDER — SODIUM CHLORIDE 0.9 % IV SOLN
INTRAVENOUS | Status: DC
  Administered 2020-08-03: 20 mL/h via INTRAVENOUS

## 2020-08-03 MED ORDER — PROPOFOL 500 MG/50ML IV EMUL
INTRAVENOUS | Status: DC | PRN
  Administered 2020-08-03: 175 ug/kg/min via INTRAVENOUS

## 2020-08-03 MED ORDER — SODIUM CHLORIDE 0.9 % IV SOLN: INTRAVENOUS | Status: DC | PRN

## 2020-08-03 MED ORDER — PROPOFOL 10 MG/ML IV BOLUS
INTRAVENOUS | Status: DC | PRN
  Administered 2020-08-03: 50 mg via INTRAVENOUS
  Administered 2020-08-03: 30 mg via INTRAVENOUS

## 2020-08-03 NOTE — Anesthesia Preprocedure Evaluation (Signed)
Anesthesia Evaluation  Patient identified by MRN, date of birth, ID band Patient awake    Reviewed: Allergy & Precautions, H&P , NPO status , Patient's Chart, lab work & pertinent test results, reviewed documented beta blocker date and time   History of Anesthesia Complications Negative for: history of anesthetic complications  Airway Mallampati: II  TM Distance: >3 FB Neck ROM: full    Dental no notable dental hx.    Pulmonary neg pulmonary ROS,    Pulmonary exam normal breath sounds clear to auscultation       Cardiovascular Exercise Tolerance: Good negative cardio ROS   Rhythm:regular Rate:Normal     Neuro/Psych PSYCHIATRIC DISORDERS Anxiety Depression negative neurological ROS     GI/Hepatic negative GI ROS, Neg liver ROS,   Endo/Other  neg diabetesHypothyroidism   Renal/GU negative Renal ROS  negative genitourinary   Musculoskeletal   Abdominal   Peds  Hematology negative hematology ROS (+)   Anesthesia Other Findings Past Medical History: No date: Anxiety No date: Arthritis No date: Bursitis of hip     Comment:  left No date: Cancer ([place])     Comment:  basal cell No date: Depression No date: Dyspareunia, female No date: HSV-2 (herpes simplex virus 2) infection No date: Hypothyroidism No date: Menopause No date: Osteoporosis No date: Ovarian cystic mass No date: Uterine mass   Reproductive/Obstetrics negative OB ROS                             Anesthesia Physical  Anesthesia Plan  ASA: II  Anesthesia Plan: General   Post-op Pain Management:    Induction: Intravenous  PONV Risk Score and Plan: 3 and Propofol infusion and TIVA  Airway Management Planned: Natural Airway and Nasal Cannula  Additional Equipment:   Intra-op Plan:   Post-operative Plan:   Informed Consent: I have reviewed the patients History and Physical, chart, labs and discussed the  procedure including the risks, benefits and alternatives for the proposed anesthesia with the patient or authorized representative who has indicated his/her understanding and acceptance.     Dental Advisory Given  Plan Discussed with: Anesthesiologist  Anesthesia Plan Comments:         Anesthesia Quick Evaluation

## 2020-08-03 NOTE — Anesthesia Procedure Notes (Signed)
Date/Time: 08/03/2020 9:43 AM Performed by: Nelda Marseille, CRNA Pre-anesthesia Checklist: Patient identified, Emergency Drugs available, Suction available, Patient being monitored and Timeout performed Oxygen Delivery Method: Nasal cannula

## 2020-08-03 NOTE — Progress Notes (Signed)
Patient up to the bathroom, able to pass gas and states feels much better.  Abdomen is soft, non-distended, pain is a level 2 at this time.  Patient does state that she feels ready for discharge, that getting out of bed and up to bathroom improved her pain.  Instructions given for her to continue to walk, if she lies down to lie on her left side and if abdominal pain increases she is to notify Dr. Ricky Stabs office or doctor on call for further instructions.  She verbalized understanding.  Phone numbers to call for assistance given to patient as well.

## 2020-08-03 NOTE — Transfer of Care (Signed)
Immediate Anesthesia Transfer of Care Note  Patient: Margaret Berg  Procedure(s) Performed: COLONOSCOPY WITH PROPOFOL (N/A )  Patient Location: PACU  Anesthesia Type:General  Level of Consciousness: sedated  Airway & Oxygen Therapy: Patient Spontanous Breathing and Patient connected to nasal cannula oxygen  Post-op Assessment: Report given to RN and Post -op Vital signs reviewed and stable  Post vital signs: Reviewed and stable  Last Vitals:  Vitals Value Taken Time  BP 149/64 08/03/20 0956  Temp    Pulse 72 08/03/20 0955  Resp 19 08/03/20 0955  SpO2 97 % 08/03/20 0955  Vitals shown include unvalidated device data.  Last Pain:  Vitals:   08/03/20 0955  TempSrc: (P) Temporal  PainSc:          Complications: No complications documented.

## 2020-08-03 NOTE — Anesthesia Postprocedure Evaluation (Signed)
Anesthesia Post Note  Patient: Margaret Berg  Procedure(s) Performed: COLONOSCOPY WITH PROPOFOL (N/A )  Patient location during evaluation: Endoscopy Anesthesia Type: General Level of consciousness: awake and alert Pain management: pain level controlled Vital Signs Assessment: post-procedure vital signs reviewed and stable Respiratory status: spontaneous breathing, nonlabored ventilation, respiratory function stable and patient connected to nasal cannula oxygen Cardiovascular status: blood pressure returned to baseline and stable Postop Assessment: no apparent nausea or vomiting Anesthetic complications: no   No complications documented.   Last Vitals:  Vitals:   08/03/20 1025 08/03/20 1035  BP: (!) 145/65 (!) 152/67  Pulse: 76 74  Resp: 15 12  Temp:    SpO2: 100% 100%    Last Pain:  Vitals:   08/03/20 1118  TempSrc:   PainSc: 2                  Martha Clan

## 2020-08-03 NOTE — H&P (Signed)
Outpatient short stay form Pre-procedure 08/03/2020 9:26 AM Margaret Berg K. Margaret Berg, M.D.  Primary Physician: Margaret Berg, M.D.  Reason for visit:  Personal history of adenomatous colon polyp x 1  History of present illness:                            Patient presents for colonoscopy for a personal hx of colon polyps. The patient denies abdominal pain, abnormal weight loss or rectal bleeding.      Current Facility-Administered Medications:  .  0.9 %  sodium chloride infusion, , Intravenous, Continuous, Margaret Berg, Margaret Pike, MD, Last Rate: 20 mL/hr at 08/03/20 0855, 20 mL/hr at 08/03/20 0855  Medications Prior to Admission  Medication Sig Dispense Refill Last Dose  . Calcium-Vitamin D 600-200 MG-UNIT tablet Take by mouth.   Past Week at Unknown time  . Cholecalciferol (VITAMIN D3) 2000 units capsule Take by mouth.   Past Week at Unknown time  . EPINEPHrine 0.3 mg/0.3 mL IJ SOAJ injection Inject into the muscle.   Past Week at Unknown time  . glucosamine-chondroitin 500-400 MG tablet Take 1 tablet by mouth 3 (three) times daily.   Past Week at Unknown time  . LORazepam (ATIVAN) 0.5 MG tablet Take 0.5 mg by mouth at bedtime as needed for anxiety.   Past Week at Unknown time  . valACYclovir (VALTREX) 1000 MG tablet Take by mouth.   Past Week at Unknown time  . venlafaxine (EFFEXOR) 75 MG tablet    08/02/2020 at Unknown time     Allergies  Allergen Reactions  . Codeine Nausea And Vomiting     Past Medical History:  Diagnosis Date  . Anxiety   . Arthritis   . Bursitis of hip    left  . Cancer ([place])    basal cell  . Depression   . Dyspareunia, female   . HSV-2 (herpes simplex virus 2) infection   . Hypothyroidism   . Menopause   . Osteoporosis   . Ovarian cystic mass   . Uterine mass     Review of systems:  Otherwise negative.    Physical Exam  Gen: Alert, oriented. Appears stated age.  HEENT: Margaret Berg/AT. PERRLA. Lungs: CTA, no wheezes. CV: RR nl S1, S2. Abd: soft, benign, no  masses. BS+ Ext: No edema. Pulses 2+    Planned procedures: Proceed with colonoscopy. The patient understands the nature of the planned procedure, indications, risks, alternatives and potential complications including but not limited to bleeding, infection, perforation, damage to internal organs and possible oversedation/side effects from anesthesia. The patient agrees and gives consent to proceed.  Please refer to procedure notes for findings, recommendations and patient disposition/instructions.     Margaret Berg K. Margaret Berg, M.D. Gastroenterology 08/03/2020  9:26 AM

## 2020-08-03 NOTE — Interval H&P Note (Signed)
History and Physical Interval Note:  08/03/2020 9:27 AM  Margaret Berg  has presented today for surgery, with the diagnosis of PERSONAL HX.OF COLON POLYPS.  The various methods of treatment have been discussed with the patient and family. After consideration of risks, benefits and other options for treatment, the patient has consented to  Procedure(s): COLONOSCOPY WITH PROPOFOL (N/A) as a surgical intervention.  The patient's history has been reviewed, patient examined, no change in status, stable for surgery.  I have reviewed the patient's chart and labs.  Questions were answered to the patient's satisfaction.     [place], [place]

## 2020-08-03 NOTE — Op Note (Signed)
[facility] Gastroenterology Patient Name: Margaret Berg Procedure Date: 08/03/2020 9:18 AM MRN: [id number] Account #: [id number] Date of Birth: Aug 07, 1951 Admit Type: Outpatient Age: 69 Room: [facility] ENDO ROOM 2 Gender: Female Note Status: Finalized Procedure:             Colonoscopy Indications:           Surveillance: Personal history of adenomatous polyps                         on last colonoscopy > 5 years ago Providers:             Lorie Apley K. Tearra Ouk MD, MD Medicines:             Propofol per Anesthesia Complications:         No immediate complications. Procedure:             Pre-Anesthesia Assessment:                        - The risks and benefits of the procedure and the                         sedation options and risks were discussed with the                         patient. All questions were answered and informed                         consent was obtained.                        - Patient identification and proposed procedure were                         verified prior to the procedure by the nurse. The                         procedure was verified in the procedure room.                        - ASA Grade Assessment: III - A patient with severe                         systemic disease.                        - After reviewing the risks and benefits, the patient                         was deemed in satisfactory condition to undergo the                         procedure.                        After obtaining informed consent, the colonoscope was                         passed under direct vision. Throughout the procedure,  the patient's blood pressure, pulse, and oxygen                         saturations were monitored continuously. The                         Colonoscope was introduced through the anus and                         advanced to the the cecum, identified by appendiceal                         orifice and  ileocecal valve. The colonoscopy was                         performed without difficulty. The patient tolerated                         the procedure well. The quality of the bowel                         preparation was excellent. The ileocecal valve,                         appendiceal orifice, and rectum were photographed. Findings:      The perianal and digital rectal examinations were normal. Pertinent       negatives include normal sphincter tone and no palpable rectal lesions.      Non-bleeding internal hemorrhoids were found during retroflexion. The       hemorrhoids were Grade I (internal hemorrhoids that do not prolapse).      A few small-mouthed diverticula were found in the sigmoid colon.      The exam was otherwise without abnormality. Impression:            - Non-bleeding internal hemorrhoids.                        - Diverticulosis in the sigmoid colon.                        - The examination was otherwise normal.                        - No specimens collected. Recommendation:        - Patient has a contact number available for                         emergencies. The signs and symptoms of potential                         delayed complications were discussed with the patient.                         Return to normal activities tomorrow. Written                         discharge instructions were provided to the patient.                        -  Resume previous diet.                        - Continue present medications.                        - Repeat colonoscopy in 5 years for surveillance.                        - Return to GI office PRN.                        - The findings and recommendations were discussed with                         the patient. Procedure Code(s):     --- Professional ---                        N5041, Colorectal cancer screening; colonoscopy on                         individual at high risk Diagnosis Code(s):     --- Professional ---                         K57.30, Diverticulosis of large intestine without                         perforation or abscess without bleeding                        K64.0, First degree hemorrhoids                        Z86.010, Personal history of colonic polyps CPT copyright 2019 American Medical Association. All rights reserved. The codes documented in this report are preliminary and upon coder review may  be revised to meet current compliance requirements. Efrain Sella MD, MD 08/03/2020 9:56:20 AM This report has been signed electronically. Number of Addenda: 0 Note Initiated On: 08/03/2020 9:18 AM Scope Withdrawal Time: 0 hours 5 minutes 53 seconds  Total Procedure Duration: 0 hours 12 minutes 31 seconds  Estimated Blood Loss:  Estimated blood loss: none.      [facility]

## 2020-08-06 ENCOUNTER — Encounter: Payer: Self-pay | Admitting: Internal Medicine

## 2020-09-06 ENCOUNTER — Telehealth: Payer: Self-pay | Admitting: Obstetrics and Gynecology

## 2020-09-06 NOTE — Telephone Encounter (Signed)
The receptionist over at [facility] in [place] states they need records of an EKG that was done here at our office in 2015. The performed one on the patient and stated they needed something to compare theres too. Could you please advise?

## 2020-09-07 NOTE — Telephone Encounter (Signed)
May I pls have the name of the receptionist at [facility] in [facility] and I also need a phone number pls. ty  CM

## 2020-10-30 HISTORY — PX: BREAST SURGERY: SHX581

## 2021-03-13 ENCOUNTER — Encounter: Payer: Self-pay | Admitting: Obstetrics and Gynecology

## 2021-03-13 ENCOUNTER — Ambulatory Visit (INDEPENDENT_AMBULATORY_CARE_PROVIDER_SITE_OTHER): Payer: Medicare Other | Admitting: Obstetrics and Gynecology

## 2021-03-13 ENCOUNTER — Other Ambulatory Visit: Payer: Self-pay

## 2021-03-13 VITALS — BP 152/83 | HR 83 | Ht 63.0 in | Wt 142.6 lb

## 2021-03-13 DIAGNOSIS — N8111 Cystocele, midline: Secondary | ICD-10-CM | POA: Diagnosis not present

## 2021-03-13 DIAGNOSIS — N952 Postmenopausal atrophic vaginitis: Secondary | ICD-10-CM | POA: Diagnosis not present

## 2021-03-13 NOTE — Progress Notes (Signed)
HPI:      [name] is a 70 y.o. [phone] who LMP was No LMP recorded. Patient is postmenopausal.  Subjective:   She presents today for her annual examination.  She has no complaints.  In November she had her bilateral breast implants removed and replaced.  She is very happy that she had this done and feels much better with her new implants. She is not reporting any problems with her vaginal atrophy or small cystocele.    Hx: The following portions of the patient's history were reviewed and updated as appropriate:             She  has a past medical history of Anxiety, Arthritis, Bursitis of hip, Cancer ([place]), Depression, Dyspareunia, female, HSV-2 (herpes simplex virus 2) infection, Hypothyroidism, Menopause, Osteoporosis, Ovarian cystic mass, and Uterine mass. She does not have any pertinent problems on file. She  has a past surgical history that includes Colonoscopy with propofol (N/A, 07/20/2015); Bilateral salpingoophorectomy; Myomectomy; Excision basal cell carcinoma; Oophorectomy (Bilateral); Breast biopsy (Right, 2013); Upper gi endoscopy; Augmentation mammaplasty (Bilateral, 1979); and Colonoscopy with propofol (N/A, 08/03/2020). Her family history includes Diabetes in her maternal grandfather; Osteoporosis in her maternal grandmother. She  reports that she has never smoked. She has never used smokeless tobacco. She reports current alcohol use. She reports that she does not use drugs. She has a current medication list which includes the following prescription(s): calcium-vitamin d, vitamin d3, epinephrine, glucosamine-chondroitin, lorazepam, omeprazole, valacyclovir, and venlafaxine. She is allergic to codeine.       Review of Systems:  Review of Systems  Constitutional: Denied constitutional symptoms, night sweats, recent illness, fatigue, fever, insomnia and weight loss.  Eyes: Denied eye symptoms, eye pain, photophobia, vision change and visual disturbance.   Ears/Nose/Throat/Neck: Denied ear, nose, throat or neck symptoms, hearing loss, nasal discharge, sinus congestion and sore throat.  Cardiovascular: Denied cardiovascular symptoms, arrhythmia, chest pain/pressure, edema, exercise intolerance, orthopnea and palpitations.  Respiratory: Denied pulmonary symptoms, asthma, pleuritic pain, productive sputum, cough, dyspnea and wheezing.  Gastrointestinal: Denied, gastro-esophageal reflux, melena, nausea and vomiting.  Genitourinary: Denied genitourinary symptoms including symptomatic vaginal discharge, pelvic relaxation issues, and urinary complaints.  Musculoskeletal: Denied musculoskeletal symptoms, stiffness, swelling, muscle weakness and myalgia.  Dermatologic: Denied dermatology symptoms, rash and scar.  Neurologic: Denied neurology symptoms, dizziness, headache, neck pain and syncope.  Psychiatric: Denied psychiatric symptoms, anxiety and depression.  Endocrine: Denied endocrine symptoms including hot flashes and night sweats.   Meds:   Current Outpatient Medications on File Prior to Visit  Medication Sig Dispense Refill  . Calcium-Vitamin D 600-200 MG-UNIT tablet Take by mouth.    . Cholecalciferol (VITAMIN D3) 2000 units capsule Take by mouth.    . EPINEPHrine 0.3 mg/0.3 mL IJ SOAJ injection Inject into the muscle.    Marland Kitchen glucosamine-chondroitin 500-400 MG tablet Take 1 tablet by mouth 3 (three) times daily.    Marland Kitchen LORazepam (ATIVAN) 0.5 MG tablet Take 0.5 mg by mouth at bedtime as needed for anxiety.    Marland Kitchen omeprazole (PRILOSEC) 20 MG capsule Take 1 capsule by mouth daily.    . valACYclovir (VALTREX) 1000 MG tablet Take by mouth.    . venlafaxine (EFFEXOR) 75 MG tablet      No current facility-administered medications on file prior to visit.          Objective:     Vitals:   03/13/21 1427  BP: (!) 152/83  Pulse: 83    Filed Weights   03/13/21  1427  Weight: 142 lb 9.6 oz (64.7 kg)              Physical examination General  NAD, Conversant  HEENT Atraumatic; Op clear with mmm.  Normo-cephalic. Pupils reactive. Anicteric sclerae  Thyroid/Neck Smooth without nodularity or enlargement. Normal ROM.  Neck Supple.  Skin No rashes, lesions or ulceration. Normal palpated skin turgor. No nodularity.  Breasts: No masses or discharge.  Symmetric.  No axillary adenopathy.   Bilateral implants.   right breast with superficial cystic type palpable mass approximately 3:00.  Oblong 2 and half centimeters in length by 1 cm in width.  Superficial to breast implant.  Lungs: Clear to auscultation.No rales or wheezes. Normal Respiratory effort, no retractions.  Heart: NSR.  No murmurs or rubs appreciated. No periferal edema  Abdomen: Soft.  Non-tender.  No masses.  No HSM. No hernia  Extremities: Moves all appropriately.  Normal ROM for age. No lymphadenopathy.  Neuro: Oriented to PPT.  Normal mood. Normal affect.     Pelvic:   Vulva: Normal appearance.  No lesions.  Vagina: No lesions or abnormalities noted.  Moderate vaginal atrophy  Support: Normal pelvic support.  Midline cystocele  Urethra No masses tenderness or scarring.  Meatus Normal size without lesions or prolapse.  Cervix: Normal appearance.  No lesions.  Anus: Normal exam.  No lesions.  Perineum: Normal exam.  No lesions.        Bimanual   Uterus: Normal size.  Non-tender.  Mobile.  AV.  Adnexae: No masses.  Non-tender to palpation.  Cul-de-sac: Negative for abnormality.      Assessment:    [id number] Patient Active Problem List   Diagnosis Date Noted  . Status post bilateral salpingo-oophorectomy (BSO) 03/03/2017  . Vaginal atrophy 03/03/2017  . H/O bilateral breast implants 03/03/2017  . Allergy to environmental factors 11/06/2014  . Degeneration of intervertebral disc of lumbar region 08/28/2014  . Nerve root inflammation 08/28/2014  . Anxiety 06/29/2014  . Clinical depression 06/29/2014  . Adult hypothyroidism 06/29/2014  . Surgical menopause  06/29/2014  . OP (osteoporosis) 06/29/2014  . Bursitis of hip 05/16/2014  . H/O malignant neoplasm of skin 08/26/2013  . H/O neoplasm 08/26/2013  . Basal cell carcinoma of face 08/24/2012     1. Vaginal atrophy   2. Cystocele, midline        Plan:            1.  Basic Screening Recommendations The basic screening recommendations for asymptomatic women were discussed with the patient during her visit.  The age-appropriate recommendations were discussed with her and the rational for the tests reviewed.  When I am informed by the patient that another primary care physician has previously obtained the age-appropriate tests and they are up-to-date, only outstanding tests are ordered and referrals given as necessary.  Abnormal results of tests will be discussed with her when all of her results are completed.  Routine preventative health maintenance measures emphasized: Exercise/Diet/Weight control, Tobacco Warnings, Alcohol/Substance use risks and Stress Management  Orders No orders of the defined types were placed in this encounter.   No orders of the defined types were placed in this encounter.           F/U  No follow-ups on file. I spent 22 minutes involved in the care of this patient preparing to see the patient by obtaining and reviewing her medical history (including labs, imaging tests and prior procedures), documenting clinical information in the electronic health record (EHR),  counseling and coordinating care plans, writing and sending prescriptions, ordering tests or procedures and directly communicating with the patient by discussing pertinent items from her history and physical exam as well as detailing my assessment and plan as noted above so that she has an informed understanding.  All of her questions were answered.  Finis Bud, M.D. 03/13/2021 3:05 PM

## 2021-03-26 ENCOUNTER — Other Ambulatory Visit: Payer: Self-pay

## 2021-03-26 ENCOUNTER — Encounter: Payer: Self-pay | Admitting: Ophthalmology

## 2021-03-28 ENCOUNTER — Other Ambulatory Visit: Payer: Self-pay

## 2021-03-28 ENCOUNTER — Other Ambulatory Visit
Admission: RE | Admit: 2021-03-28 | Discharge: 2021-03-28 | Disposition: A | Discharge status: Home / Self Care | Payer: Medicare Other | Source: Ambulatory Visit | Attending: Ophthalmology | Admitting: Ophthalmology

## 2021-03-28 DIAGNOSIS — Z20822 Contact with and (suspected) exposure to covid-19: Secondary | ICD-10-CM | POA: Insufficient documentation

## 2021-03-28 DIAGNOSIS — Z01812 Encounter for preprocedural laboratory examination: Secondary | ICD-10-CM | POA: Diagnosis present

## 2021-03-28 LAB — SARS CORONAVIRUS 2 (TAT 6-24 HRS): SARS Coronavirus 2: NEGATIVE

## 2021-03-28 NOTE — Discharge Instructions (Signed)

## 2021-04-01 ENCOUNTER — Encounter: Payer: Self-pay | Admitting: Ophthalmology

## 2021-04-01 ENCOUNTER — Ambulatory Visit: Payer: Medicare Other | Admitting: Anesthesiology

## 2021-04-01 ENCOUNTER — Encounter
Admission: RE | Disposition: A | Discharge status: Home / Self Care | Payer: Self-pay | Source: Home / Self Care | Attending: Ophthalmology

## 2021-04-01 ENCOUNTER — Ambulatory Visit
Admission: RE | Admit: 2021-04-01 | Discharge: 2021-04-01 | Disposition: A | Discharge status: Home / Self Care | Payer: Medicare Other | Attending: Ophthalmology | Admitting: Ophthalmology

## 2021-04-01 ENCOUNTER — Other Ambulatory Visit: Payer: Self-pay

## 2021-04-01 DIAGNOSIS — Z885 Allergy status to narcotic agent status: Secondary | ICD-10-CM | POA: Insufficient documentation

## 2021-04-01 DIAGNOSIS — H2511 Age-related nuclear cataract, right eye: Secondary | ICD-10-CM | POA: Insufficient documentation

## 2021-04-01 DIAGNOSIS — Z79899 Other long term (current) drug therapy: Secondary | ICD-10-CM | POA: Diagnosis not present

## 2021-04-01 DIAGNOSIS — Z833 Family history of diabetes mellitus: Secondary | ICD-10-CM | POA: Insufficient documentation

## 2021-04-01 DIAGNOSIS — Z8262 Family history of osteoporosis: Secondary | ICD-10-CM | POA: Insufficient documentation

## 2021-04-01 DIAGNOSIS — Z85828 Personal history of other malignant neoplasm of skin: Secondary | ICD-10-CM | POA: Diagnosis not present

## 2021-04-01 HISTORY — DX: Gastro-esophageal reflux disease without esophagitis: K21.9

## 2021-04-01 HISTORY — PX: CATARACT EXTRACTION W/PHACO: SHX586

## 2021-04-01 HISTORY — DX: Motion sickness, initial encounter: T75.3XXA

## 2021-04-01 SURGERY — PHACOEMULSIFICATION, CATARACT, WITH IOL INSERTION
Anesthesia: Monitor Anesthesia Care | Site: Eye | Laterality: Right

## 2021-04-01 MED ORDER — LIDOCAINE HCL (PF) 2 % IJ SOLN
INTRAOCULAR | Status: DC | PRN
  Administered 2021-04-01: 1 mL via INTRAOCULAR

## 2021-04-01 MED ORDER — EPINEPHRINE PF 1 MG/ML IJ SOLN
INTRAOCULAR | Status: DC | PRN
  Administered 2021-04-01: 68 mL via OPHTHALMIC

## 2021-04-01 MED ORDER — TETRACAINE HCL 0.5 % OP SOLN
1.0000 [drp] | OPHTHALMIC | Status: DC | PRN
  Administered 2021-04-01 (×3): 1 [drp] via OPHTHALMIC

## 2021-04-01 MED ORDER — MIDAZOLAM HCL 2 MG/2ML IJ SOLN
INTRAMUSCULAR | Status: DC | PRN
  Administered 2021-04-01: 1 mg via INTRAVENOUS

## 2021-04-01 MED ORDER — ARMC OPHTHALMIC DILATING DROPS
1.0000 "application " | OPHTHALMIC | Status: DC | PRN
  Administered 2021-04-01 (×3): 1 via OPHTHALMIC

## 2021-04-01 MED ORDER — MOXIFLOXACIN HCL 0.5 % OP SOLN
OPHTHALMIC | Status: DC | PRN
  Administered 2021-04-01: 0.2 mL via OPHTHALMIC

## 2021-04-01 MED ORDER — SODIUM HYALURONATE 23 MG/ML IO SOLN
INTRAOCULAR | Status: DC | PRN
  Administered 2021-04-01: 0.6 mL via INTRAOCULAR

## 2021-04-01 MED ORDER — SODIUM HYALURONATE 10 MG/ML IO SOLN
INTRAOCULAR | Status: DC | PRN
  Administered 2021-04-01: 0.55 mL via INTRAOCULAR

## 2021-04-01 MED ORDER — FENTANYL CITRATE (PF) 100 MCG/2ML IJ SOLN
INTRAMUSCULAR | Status: DC | PRN
  Administered 2021-04-01: 50 ug via INTRAVENOUS

## 2021-04-01 SURGICAL SUPPLY — 18 items
CANNULA ANT/CHMB 27GA (MISCELLANEOUS) ×4 IMPLANT
DISSECTOR HYDRO NUCLEUS 50X22 (MISCELLANEOUS) ×2 IMPLANT
GLOVE PI ULTRA LF STRL 7.5 (GLOVE) ×1 IMPLANT
GLOVE PI ULTRA NON LATEX 7.5 (GLOVE) ×1
GLOVE SURG SYN 8.5  E (GLOVE) ×1
GLOVE SURG SYN 8.5 E (GLOVE) ×1 IMPLANT
GOWN STRL REUS W/ TWL LRG LVL3 (GOWN DISPOSABLE) ×2 IMPLANT
GOWN STRL REUS W/TWL LRG LVL3 (GOWN DISPOSABLE) ×4
LENS ACRYSOF TORIC TRIFOC 20.5 ×2 IMPLANT
LENS IOL ACRSF IQ PAN 20.5 ×1 IMPLANT
MARKER SKIN DUAL TIP RULER LAB (MISCELLANEOUS) ×2 IMPLANT
PACK DR. KING ARMS (PACKS) ×2 IMPLANT
PACK EYE AFTER SURG (MISCELLANEOUS) ×2 IMPLANT
PACK OPTHALMIC (MISCELLANEOUS) ×2 IMPLANT
SYR 3ML LL SCALE MARK (SYRINGE) ×2 IMPLANT
SYR TB 1ML LUER SLIP (SYRINGE) ×2 IMPLANT
WATER STERILE IRR 250ML POUR (IV SOLUTION) ×2 IMPLANT
WIPE NON LINTING 3.25X3.25 (MISCELLANEOUS) ×2 IMPLANT

## 2021-04-01 NOTE — H&P (Signed)
[facility]   Primary Care Physician:  Sofie Hartigan, MD Ophthalmologist: Dr. Benay Pillow  Pre-Procedure History & Physical: HPI:  ISSABELLE Berg is a 70 y.o. female here for cataract surgery.   Past Medical History:  Diagnosis Date  . Anxiety   . Arthritis   . Bursitis of hip    left  . Cancer ([place])    basal cell  . Depression   . Dyspareunia, female   . GERD (gastroesophageal reflux disease)   . HSV-2 (herpes simplex virus 2) infection   . Hypothyroidism   . Menopause   . Motion sickness    [address] when not driving, boats  . Osteoporosis   . Ovarian cystic mass   . Uterine mass     Past Surgical History:  Procedure Laterality Date  . AUGMENTATION MAMMAPLASTY Bilateral 1856   SILICONE  . BASAL CELL CARCINOMA EXCISION     2017,2018  . BILATERAL SALPINGOOPHORECTOMY    . BREAST BIOPSY Right 2013   NEG  . BREAST SURGERY Bilateral 10/30/2020   Implants removed and new ones inserted  . COLONOSCOPY WITH PROPOFOL N/A 07/20/2015   Procedure: COLONOSCOPY WITH PROPOFOL;  Surgeon: Lollie Sails, MD;  Location[facility] ENDOSCOPY;  Service: Endoscopy;  Laterality: N/A;  . COLONOSCOPY WITH PROPOFOL N/A 08/03/2020   Procedure: COLONOSCOPY WITH PROPOFOL;  Surgeon: Toledo, Benay Pike, MD;  Location: [facility] ENDOSCOPY;  Service: Gastroenterology;  Laterality: N/A;  . MYOMECTOMY    . OOPHORECTOMY Bilateral   . UPPER GI ENDOSCOPY      Prior to Admission medications   Medication Sig Start Date End Date Taking? Authorizing Provider  Calcium-Vitamin D 600-200 MG-UNIT tablet Take by mouth. 08/24/12  Yes [provider]  Cholecalciferol (VITAMIN D3) 2000 units capsule Take by mouth. 08/24/12  Yes [provider]  EPINEPHrine 0.3 mg/0.3 mL IJ SOAJ injection Inject into the muscle.   Yes [provider]  glucosamine-chondroitin 500-400 MG tablet Take 1 tablet by mouth 3 (three) times daily.   Yes [provider]  LORazepam (ATIVAN) 0.5 MG  tablet Take 0.5 mg by mouth at bedtime as needed for anxiety.   Yes [provider]  omeprazole (PRILOSEC) 20 MG capsule Take 1 capsule by mouth daily. 08/02/20  Yes [provider]  valACYclovir (VALTREX) 1000 MG tablet Take by mouth.   Yes [provider]  venlafaxine (EFFEXOR) 75 MG tablet  02/26/07  Yes [provider]    Allergies as of 02/01/2021 - Review Complete 08/03/2020  Allergen Reaction Noted  . Codeine Nausea And Vomiting 06/22/2015    Family History  Problem Relation Age of Onset  . Osteoporosis Maternal Grandmother   . Diabetes Maternal Grandfather   . Cancer Neg Hx   . Heart disease Neg Hx   . Breast cancer Neg Hx   . Ovarian cancer Neg Hx   . Colon cancer Neg Hx     Social History   Socioeconomic History  . Marital status: Married    Spouse name: Not on file  . Number of children: Not on file  . Years of education: Not on file  . Highest education level: Not on file  Occupational History  . Not on file  Tobacco Use  . Smoking status: Never Smoker  . Smokeless tobacco: Never Used  Vaping Use  . Vaping Use: Never used  Substance and Sexual Activity  . Alcohol use: Yes    Comment: occas  . Drug use: No  .  Sexual activity: Not Currently    Birth control/protection: Surgical  Other Topics Concern  . Not on file  Social History Narrative  . Not on file   Social Determinants of Health   Financial Resource Strain: Not on file  Food Insecurity: Not on file  Transportation Needs: Not on file  Physical Activity: Not on file  Stress: Not on file  Social Connections: Not on file  Intimate Partner Violence: Not on file    Review of Systems: See HPI, otherwise negative ROS  Physical Exam: BP (!) 157/70   Pulse 71   Temp 97.6 F (36.4 C) (Temporal)   Ht 5\' 3"  (1.6 m)   Wt 63.5 kg   SpO2 98%   BMI 24.80 kg/m  General:   Alert,  pleasant and cooperative in NAD Head:  Normocephalic and atraumatic. Respiratory:   Normal work of breathing. Cardiovascular:  RRR  Impression/Plan: Margaret Berg is here for cataract surgery.  Risks, benefits, limitations, and alternatives regarding cataract surgery have been reviewed with the patient.  Questions have been answered.  All parties agreeable.   Benay Pillow, MD  04/01/2021, 9:41 AM

## 2021-04-01 NOTE — Anesthesia Postprocedure Evaluation (Signed)
Anesthesia Post Note  Patient: Margaret Berg  Procedure(s) Performed: CATARACT EXTRACTION PHACO AND INTRAOCULAR LENS PLACEMENT (IOC) RIGHT PANOPTIX LENS 1.59 00:17.1  (Right Eye)     Patient location during evaluation: PACU Anesthesia Type: MAC Level of consciousness: awake and alert Pain management: pain level controlled Vital Signs Assessment: post-procedure vital signs reviewed and stable Respiratory status: spontaneous breathing Cardiovascular status: blood pressure returned to baseline Postop Assessment: no apparent nausea or vomiting, adequate PO intake and no headache Anesthetic complications: no   No complications documented.  Adele Barthel Ezma Rehm

## 2021-04-01 NOTE — Anesthesia Preprocedure Evaluation (Signed)
Anesthesia Evaluation  Patient identified by MRN, date of birth, ID band Patient awake    History of Anesthesia Complications Negative for: history of anesthetic complications  Airway Mallampati: I  TM Distance: >3 FB Neck ROM: Full    Dental no notable dental hx.    Pulmonary neg pulmonary ROS,    Pulmonary exam normal        Cardiovascular Exercise Tolerance: Good negative cardio ROS Normal cardiovascular exam     Neuro/Psych negative neurological ROS  negative psych ROS   GI/Hepatic Neg liver ROS, GERD  Medicated and Controlled,  Endo/Other  negative endocrine ROS  Renal/GU negative Renal ROS     Musculoskeletal negative musculoskeletal ROS (+)   Abdominal   Peds  Hematology negative hematology ROS (+)   Anesthesia Other Findings   Reproductive/Obstetrics                             Anesthesia Physical Anesthesia Plan  ASA: II  Anesthesia Plan: MAC   Post-op Pain Management:    Induction: Intravenous  PONV Risk Score and Plan: 2 and TIVA, Midazolam and Treatment may vary due to age or medical condition  Airway Management Planned: Nasal Cannula and Natural Airway  Additional Equipment: None  Intra-op Plan:   Post-operative Plan:   Informed Consent: I have reviewed the patients History and Physical, chart, labs and discussed the procedure including the risks, benefits and alternatives for the proposed anesthesia with the patient or authorized representative who has indicated his/her understanding and acceptance.       Plan Discussed with: CRNA  Anesthesia Plan Comments:         Anesthesia Quick Evaluation

## 2021-04-01 NOTE — Op Note (Signed)
OPERATIVE NOTE  Margaret Berg [id number] 04/01/2021   PREOPERATIVE DIAGNOSIS:  Nuclear sclerotic cataract right eye.  H25.11   POSTOPERATIVE DIAGNOSIS:    Nuclear sclerotic cataract right eye.     PROCEDURE:  Phacoemusification with posterior chamber intraocular lens placement of the right eye   LENS:   Implant Name Type Inv. Item Serial No. Manufacturer Lot No. LRB No. Used Action  ACRYSOF TORIC TRIFOCAL 20.5 - [id number]  ACRYSOF TORIC TRIFOCAL 20.5 [id number] ALCON  Right 1 Implanted       Procedure(s): CATARACT EXTRACTION PHACO AND INTRAOCULAR LENS PLACEMENT (IOC) RIGHT PANOPTIX LENS 1.59 00:17.1  (Right)  TFNT00 +20.5   ULTRASOUND TIME: 0 minutes 17 seconds.  CDE 1.59   SURGEON:  Benay Pillow, MD, MPH  ANESTHESIOLOGIST: Anesthesiologist: Page, Adele Barthel, MD CRNA: Silvana Newness, CRNA   ANESTHESIA:  Topical with tetracaine drops augmented with 1% preservative-free intracameral lidocaine.  ESTIMATED BLOOD LOSS: less than 1 mL.   COMPLICATIONS:  None.   DESCRIPTION OF PROCEDURE:  The patient was identified in the holding room and transported to the operating room and placed in the supine position under the operating microscope.  The right eye was identified as the operative eye and it was prepped and draped in the usual sterile ophthalmic fashion.   A 1.0 millimeter clear-corneal paracentesis was made at the 10:30 position. 0.5 ml of preservative-free 1% lidocaine with epinephrine was injected into the anterior chamber.  The anterior chamber was filled with Healon 5 viscoelastic.  A 2.4 millimeter keratome was used to make a near-clear corneal incision at the 8:00 position.  A curvilinear capsulorrhexis was made with a cystotome and capsulorrhexis forceps.  Balanced salt solution was used to hydrodissect and hydrodelineate the nucleus.   Phacoemulsification was then used in stop and chop fashion to remove the lens nucleus and epinucleus.  The remaining cortex was then  removed using the irrigation and aspiration handpiece. Healon was then placed into the capsular bag to distend it for lens placement.  A lens was then injected into the capsular bag.  The remaining viscoelastic was aspirated.   Wounds were hydrated with balanced salt solution.  The anterior chamber was inflated to a physiologic pressure with balanced salt solution.   Intracameral vigamox 0.1 mL undiluted was injected into the eye and a drop placed onto the ocular surface.  The lens was well centered.  No wound leaks were noted.  The patient was taken to the recovery room in stable condition without complications of anesthesia or surgery  Benay Pillow 04/01/2021, 10:10 AM

## 2021-04-01 NOTE — Transfer of Care (Signed)
Immediate Anesthesia Transfer of Care Note  Patient: Margaret Berg  Procedure(s) Performed: CATARACT EXTRACTION PHACO AND INTRAOCULAR LENS PLACEMENT (IOC) RIGHT PANOPTIX LENS 1.59 00:17.1  (Right Eye)  Patient Location: PACU  Anesthesia Type: MAC  Level of Consciousness: awake, alert  and patient cooperative  Airway and Oxygen Therapy: Patient Spontanous Breathing and Patient connected to supplemental oxygen  Post-op Assessment: Post-op Vital signs reviewed, Patient's Cardiovascular Status Stable, Respiratory Function Stable, Patent Airway and No signs of Nausea or vomiting  Post-op Vital Signs: Reviewed and stable  Complications: No complications documented.

## 2021-04-01 NOTE — Anesthesia Procedure Notes (Signed)
Procedure Name: MAC Date/Time: 04/01/2021 9:52 AM Performed by: Silvana Newness, CRNA Pre-anesthesia Checklist: Patient identified, Emergency Drugs available, Suction available, Patient being monitored and Timeout performed Patient Re-evaluated:Patient Re-evaluated prior to induction Oxygen Delivery Method: Nasal cannula Placement Confirmation: positive ETCO2

## 2021-04-02 ENCOUNTER — Encounter: Payer: Self-pay | Admitting: Ophthalmology

## 2021-04-04 ENCOUNTER — Other Ambulatory Visit: Payer: Self-pay

## 2021-04-04 ENCOUNTER — Encounter: Payer: Self-pay | Admitting: Ophthalmology

## 2021-04-11 NOTE — Discharge Instructions (Signed)

## 2021-04-15 ENCOUNTER — Encounter
Admission: RE | Disposition: A | Discharge status: Home / Self Care | Payer: Self-pay | Source: Home / Self Care | Attending: Ophthalmology

## 2021-04-15 ENCOUNTER — Encounter: Payer: Self-pay | Admitting: Ophthalmology

## 2021-04-15 ENCOUNTER — Other Ambulatory Visit: Payer: Self-pay

## 2021-04-15 ENCOUNTER — Ambulatory Visit
Admission: RE | Admit: 2021-04-15 | Discharge: 2021-04-15 | Disposition: A | Discharge status: Home / Self Care | Payer: Medicare Other | Attending: Ophthalmology | Admitting: Ophthalmology

## 2021-04-15 ENCOUNTER — Ambulatory Visit: Payer: Medicare Other | Admitting: Anesthesiology

## 2021-04-15 DIAGNOSIS — Z885 Allergy status to narcotic agent status: Secondary | ICD-10-CM | POA: Diagnosis not present

## 2021-04-15 DIAGNOSIS — H2512 Age-related nuclear cataract, left eye: Secondary | ICD-10-CM | POA: Diagnosis not present

## 2021-04-15 DIAGNOSIS — Z9841 Cataract extraction status, right eye: Secondary | ICD-10-CM | POA: Diagnosis not present

## 2021-04-15 DIAGNOSIS — Z79899 Other long term (current) drug therapy: Secondary | ICD-10-CM | POA: Diagnosis not present

## 2021-04-15 DIAGNOSIS — Z961 Presence of intraocular lens: Secondary | ICD-10-CM | POA: Insufficient documentation

## 2021-04-15 HISTORY — PX: CATARACT EXTRACTION W/PHACO: SHX586

## 2021-04-15 SURGERY — PHACOEMULSIFICATION, CATARACT, WITH IOL INSERTION
Anesthesia: Monitor Anesthesia Care | Site: Eye | Laterality: Left

## 2021-04-15 MED ORDER — ARMC OPHTHALMIC DILATING DROPS
1.0000 "application " | OPHTHALMIC | Status: DC | PRN
  Administered 2021-04-15 (×3): 1 via OPHTHALMIC

## 2021-04-15 MED ORDER — SODIUM HYALURONATE 10 MG/ML IO SOLN
INTRAOCULAR | Status: DC | PRN
  Administered 2021-04-15: 0.55 mL via INTRAOCULAR

## 2021-04-15 MED ORDER — EPINEPHRINE PF 1 MG/ML IJ SOLN
INTRAOCULAR | Status: DC | PRN
  Administered 2021-04-15: 73 mL via OPHTHALMIC

## 2021-04-15 MED ORDER — LACTATED RINGERS IV SOLN: INTRAVENOUS | Status: DC

## 2021-04-15 MED ORDER — SODIUM HYALURONATE 23 MG/ML IO SOLN
INTRAOCULAR | Status: DC | PRN
  Administered 2021-04-15: 0.6 mL via INTRAOCULAR

## 2021-04-15 MED ORDER — FENTANYL CITRATE (PF) 100 MCG/2ML IJ SOLN
INTRAMUSCULAR | Status: DC | PRN
  Administered 2021-04-15: 50 ug via INTRAVENOUS

## 2021-04-15 MED ORDER — MIDAZOLAM HCL 2 MG/2ML IJ SOLN
INTRAMUSCULAR | Status: DC | PRN
  Administered 2021-04-15: 2 mg via INTRAVENOUS

## 2021-04-15 MED ORDER — MOXIFLOXACIN HCL 0.5 % OP SOLN
OPHTHALMIC | Status: DC | PRN
  Administered 2021-04-15: 0.2 mL via OPHTHALMIC

## 2021-04-15 MED ORDER — TETRACAINE HCL 0.5 % OP SOLN
1.0000 [drp] | OPHTHALMIC | Status: DC | PRN
  Administered 2021-04-15 (×3): 1 [drp] via OPHTHALMIC

## 2021-04-15 MED ORDER — LIDOCAINE HCL (PF) 2 % IJ SOLN
INTRAOCULAR | Status: DC | PRN
  Administered 2021-04-15: 1 mL via INTRAOCULAR

## 2021-04-15 SURGICAL SUPPLY — 19 items
CANNULA ANT/CHMB 27GA (MISCELLANEOUS) ×4 IMPLANT
DISSECTOR HYDRO NUCLEUS 50X22 (MISCELLANEOUS) ×2 IMPLANT
GLOVE PI ULTRA LF STRL 7.5 (GLOVE) ×2 IMPLANT
GLOVE PI ULTRA NON LATEX 7.5 (GLOVE) ×2
GLOVE SURG SYN 8.5  E (GLOVE) ×1
GLOVE SURG SYN 8.5 E (GLOVE) ×1 IMPLANT
GOWN STRL REUS W/ TWL LRG LVL3 (GOWN DISPOSABLE) ×2 IMPLANT
GOWN STRL REUS W/TWL LRG LVL3 (GOWN DISPOSABLE) ×4
LENS IOL IQ PAN TRC 30 20.5 ×1 IMPLANT
LENS IOL PANOP TORIC 30 20.5 ×1 IMPLANT
LENS IOL PANOPTIX TORIC 20.5 ×2 IMPLANT
MARKER SKIN DUAL TIP RULER LAB (MISCELLANEOUS) ×2 IMPLANT
PACK DR. KING ARMS (PACKS) ×2 IMPLANT
PACK EYE AFTER SURG (MISCELLANEOUS) ×2 IMPLANT
PACK OPTHALMIC (MISCELLANEOUS) ×2 IMPLANT
SYR 3ML LL SCALE MARK (SYRINGE) ×2 IMPLANT
SYR TB 1ML LUER SLIP (SYRINGE) ×2 IMPLANT
WATER STERILE IRR 250ML POUR (IV SOLUTION) ×2 IMPLANT
WIPE NON LINTING 3.25X3.25 (MISCELLANEOUS) ×2 IMPLANT

## 2021-04-15 NOTE — Op Note (Signed)
OPERATIVE NOTE  Margaret Berg [id number] 04/15/2021   PREOPERATIVE DIAGNOSIS:  Nuclear sclerotic cataract left eye.  H25.12   POSTOPERATIVE DIAGNOSIS:    Nuclear sclerotic cataract left eye.     PROCEDURE:  Phacoemusification with posterior chamber intraocular lens placement of the left eye   LENS:   Implant Name Type Inv. Item Serial No. Manufacturer Lot No. LRB No. Used Action  LENS IOL PANOPTIX TORIC 20.5 - [id number]  LENS IOL PANOPTIX TORIC 20.5 [id number] [employer]  Left 1 Implanted      Procedure(s) with comments: CATARACT EXTRACTION PHACO AND INTRAOCULAR LENS PLACEMENT (IOC) LEFT PANOPTIX TORIC LENS (Left) - 0.76 0:12.2  [id number] +20.5   ULTRASOUND TIME: 0 minutes 12 seconds.  CDE 0.76   SURGEON:  Benay Pillow, MD, MPH   ANESTHESIA:  Topical with tetracaine drops augmented with 1% preservative-free intracameral lidocaine.  ESTIMATED BLOOD LOSS: <1 mL   COMPLICATIONS:  None.   DESCRIPTION OF PROCEDURE:  The patient was identified in the holding room and transported to the operating room and placed in the supine position under the operating microscope.  The left eye was identified as the operative eye and it was prepped and draped in the usual sterile ophthalmic fashion.  The verion system was registered without difficulty.   A 1.0 millimeter clear-corneal paracentesis was made at the 5:00 position. 0.5 ml of preservative-free 1% lidocaine with epinephrine was injected into the anterior chamber.  The anterior chamber was filled with Healon 5 viscoelastic.  A 2.4 millimeter keratome was used to make a near-clear corneal incision at the 2:00 position.  A curvilinear capsulorrhexis was made with a cystotome and capsulorrhexis forceps.  Balanced salt solution was used to hydrodissect and hydrodelineate the nucleus.   Phacoemulsification was then used in stop and chop fashion to remove the lens nucleus and epinucleus.  The remaining cortex was then removed using the irrigation  and aspiration handpiece. Healon was then placed into the capsular bag to distend it for lens placement.  A lens was then injected into the capsular bag.  The remaining viscoelastic was aspirated.  The lens was rotated to 103 degrees with guidance from the [employer] system.   Wounds were hydrated with balanced salt solution.  The anterior chamber was inflated to a physiologic pressure with balanced salt solution.  Intracameral vigamox 0.1 mL undiltued was injected into the eye and a drop placed onto the ocular surface.  No wound leaks were noted.  The patient was taken to the recovery room in stable condition without complications of anesthesia or surgery  Benay Pillow 04/15/2021, 10:19 AM

## 2021-04-15 NOTE — H&P (Signed)
Margaret Berg   Primary Care Physician:  Sofie Hartigan, MD Ophthalmologist: Dr. Benay Pillow  Pre-Procedure History & Physical: HPI:  Margaret Berg is a 70 y.o. female here for cataract surgery.   Past Medical History:  Diagnosis Date  . Anxiety   . Arthritis   . Bursitis of hip    left  . Cancer ([place])    basal cell  . Depression   . Dyspareunia, female   . GERD (gastroesophageal reflux disease)   . HSV-2 (herpes simplex virus 2) infection   . Hypothyroidism   . Menopause   . Motion sickness    [place] roads when not driving, boats  . Osteoporosis   . Ovarian cystic mass   . Uterine mass     Past Surgical History:  Procedure Laterality Date  . AUGMENTATION MAMMAPLASTY Bilateral 1062   SILICONE  . BASAL CELL CARCINOMA EXCISION     2017,2018  . BILATERAL SALPINGOOPHORECTOMY    . BREAST BIOPSY Right 2013   NEG  . BREAST SURGERY Bilateral 10/30/2020   Implants removed and new ones inserted  . CATARACT EXTRACTION W/PHACO Right 04/01/2021   Procedure: CATARACT EXTRACTION PHACO AND INTRAOCULAR LENS PLACEMENT (IOC) RIGHT PANOPTIX LENS 1.59 00:17.1 ;  Surgeon: Eulogio Bear, MD;  Location: [facility];  Service: Ophthalmology;  Laterality: Right;  . COLONOSCOPY WITH PROPOFOL N/A 07/20/2015   Procedure: COLONOSCOPY WITH PROPOFOL;  Surgeon: Lollie Sails, MD;  Location[facility] ENDOSCOPY;  Service: Endoscopy;  Laterality: N/A;  . COLONOSCOPY WITH PROPOFOL N/A 08/03/2020   Procedure: COLONOSCOPY WITH PROPOFOL;  Surgeon: Toledo, Benay Pike, MD;  Location: [facility] ENDOSCOPY;  Service: Gastroenterology;  Laterality: N/A;  . MYOMECTOMY    . OOPHORECTOMY Bilateral   . UPPER GI ENDOSCOPY      Prior to Admission medications   Medication Sig Start Date End Date Taking? Authorizing Provider  Calcium-Vitamin D 600-200 MG-UNIT tablet Take by mouth. 08/24/12  Yes [provider]  Cholecalciferol (VITAMIN D3) 2000 units capsule Take by mouth. 08/24/12  Yes  [provider]  EPINEPHrine 0.3 mg/0.3 mL IJ SOAJ injection Inject into the muscle.   Yes [provider]  glucosamine-chondroitin 500-400 MG tablet Take 1 tablet by mouth 3 (three) times daily.   Yes [provider]  LORazepam (ATIVAN) 0.5 MG tablet Take 0.5 mg by mouth at bedtime as needed for anxiety.   Yes [provider]  omeprazole (PRILOSEC) 20 MG capsule Take 1 capsule by mouth daily. 08/02/20  Yes [provider]  valACYclovir (VALTREX) 1000 MG tablet Take by mouth.   Yes [provider]  venlafaxine (EFFEXOR) 75 MG tablet  02/26/07  Yes [provider]    Allergies as of 02/01/2021 - Review Complete 08/03/2020  Allergen Reaction Noted  . Codeine Nausea And Vomiting 06/22/2015    Family History  Problem Relation Age of Onset  . Osteoporosis Maternal Grandmother   . Diabetes Maternal Grandfather   . Cancer Neg Hx   . Heart disease Neg Hx   . Breast cancer Neg Hx   . Ovarian cancer Neg Hx   . Colon cancer Neg Hx     Social History   Socioeconomic History  . Marital status: Married    Spouse name: Not on file  . Number of children: Not on file  . Years of education: Not on file  . Highest education level: Not on file  Occupational History  . Not on file  Tobacco Use  .  Smoking status: Never Smoker  . Smokeless tobacco: Never Used  Vaping Use  . Vaping Use: Never used  Substance and Sexual Activity  . Alcohol use: Yes    Comment: occas  . Drug use: No  . Sexual activity: Not Currently    Birth control/protection: Surgical  Other Topics Concern  . Not on file  Social History Narrative  . Not on file   Social Determinants of Health   Financial Resource Strain: Not on file  Food Insecurity: Not on file  Transportation Needs: Not on file  Physical Activity: Not on file  Stress: Not on file  Social Connections: Not on file  Intimate Partner Violence: Not on file    Review of Systems: See HPI,  otherwise negative ROS  Physical Exam: BP (!) 155/65   Pulse 71   Temp 97.7 F (36.5 C) (Temporal)   Resp 16   Ht 5\' 3"  (1.6 m)   Wt 62.6 kg   SpO2 97%   BMI 24.45 kg/m  General:   Alert,  pleasant and cooperative in NAD Head:  Normocephalic and atraumatic. Respiratory:  Normal work of breathing. Cardiovascular:  RRR  Impression/Plan: Margaret Berg is here for cataract surgery.  Risks, benefits, limitations, and alternatives regarding cataract surgery have been reviewed with the patient.  Questions have been answered.  All parties agreeable.   Benay Pillow, MD  04/15/2021, 9:48 AM

## 2021-04-15 NOTE — Anesthesia Preprocedure Evaluation (Signed)
Anesthesia Evaluation  Patient identified by MRN, date of birth, ID band Patient awake    Reviewed: NPO status   History of Anesthesia Complications Negative for: history of anesthetic complications  Airway Mallampati: II  TM Distance: >3 FB Neck ROM: full    Dental no notable dental hx.    Pulmonary neg pulmonary ROS,    Pulmonary exam normal        Cardiovascular Exercise Tolerance: Good negative cardio ROS Normal cardiovascular exam     Neuro/Psych Anxiety negative neurological ROS     GI/Hepatic Neg liver ROS, GERD  Controlled,  Endo/Other  Hypothyroidism (no meds now)   Renal/GU negative Renal ROS  negative genitourinary   Musculoskeletal  (+) Arthritis ,   Abdominal   Peds  Hematology negative hematology ROS (+)   Anesthesia Other Findings   Reproductive/Obstetrics                             Anesthesia Physical Anesthesia Plan  ASA: II  Anesthesia Plan: MAC   Post-op Pain Management:    Induction:   PONV Risk Score and Plan:   Airway Management Planned:   Additional Equipment:   Intra-op Plan:   Post-operative Plan:   Informed Consent: I have reviewed the patients History and Physical, chart, labs and discussed the procedure including the risks, benefits and alternatives for the proposed anesthesia with the patient or authorized representative who has indicated his/her understanding and acceptance.       Plan Discussed with: CRNA  Anesthesia Plan Comments:         Anesthesia Quick Evaluation

## 2021-04-15 NOTE — Anesthesia Postprocedure Evaluation (Signed)
Anesthesia Post Note  Patient: Margaret Berg  Procedure(s) Performed: CATARACT EXTRACTION PHACO AND INTRAOCULAR LENS PLACEMENT (IOC) LEFT PANOPTIX TORIC LENS (Left Eye)     Patient location during evaluation: PACU Anesthesia Type: MAC Level of consciousness: awake and alert Pain management: pain level controlled Vital Signs Assessment: post-procedure vital signs reviewed and stable Respiratory status: spontaneous breathing, nonlabored ventilation, respiratory function stable and patient connected to nasal cannula oxygen Cardiovascular status: stable and blood pressure returned to baseline Postop Assessment: no apparent nausea or vomiting Anesthetic complications: no   No complications documented.  Fidel Levy

## 2021-04-15 NOTE — Anesthesia Procedure Notes (Signed)
Procedure Name: MAC Date/Time: 04/15/2021 10:00 AM Performed by: Silvana Newness, CRNA Pre-anesthesia Checklist: Patient identified, Emergency Drugs available, Suction available, Patient being monitored and Timeout performed Patient Re-evaluated:Patient Re-evaluated prior to induction Oxygen Delivery Method: Nasal cannula Placement Confirmation: positive ETCO2

## 2021-04-15 NOTE — Transfer of Care (Signed)
Immediate Anesthesia Transfer of Care Note  Patient: Margaret Berg  Procedure(s) Performed: CATARACT EXTRACTION PHACO AND INTRAOCULAR LENS PLACEMENT (IOC) LEFT PANOPTIX TORIC LENS (Left Eye)  Patient Location: PACU  Anesthesia Type: MAC  Level of Consciousness: awake, alert  and patient cooperative  Airway and Oxygen Therapy: Patient Spontanous Breathing and Patient connected to supplemental oxygen  Post-op Assessment: Post-op Vital signs reviewed, Patient's Cardiovascular Status Stable, Respiratory Function Stable, Patent Airway and No signs of Nausea or vomiting  Post-op Vital Signs: Reviewed and stable  Complications: No complications documented.

## 2021-04-16 ENCOUNTER — Encounter: Payer: Self-pay | Admitting: Ophthalmology

## 2021-06-25 ENCOUNTER — Other Ambulatory Visit: Payer: Self-pay | Admitting: Family Medicine

## 2021-06-25 DIAGNOSIS — Z1231 Encounter for screening mammogram for malignant neoplasm of breast: Secondary | ICD-10-CM

## 2021-07-26 ENCOUNTER — Other Ambulatory Visit: Payer: Self-pay

## 2021-07-26 ENCOUNTER — Ambulatory Visit
Admission: RE | Admit: 2021-07-26 | Discharge: 2021-07-26 | Disposition: A | Discharge status: Home / Self Care | Payer: Medicare Other | Source: Ambulatory Visit | Attending: Family Medicine | Admitting: Family Medicine

## 2021-07-26 DIAGNOSIS — Z1231 Encounter for screening mammogram for malignant neoplasm of breast: Secondary | ICD-10-CM

## 2022-03-18 ENCOUNTER — Other Ambulatory Visit: Payer: Self-pay

## 2022-03-18 ENCOUNTER — Ambulatory Visit (INDEPENDENT_AMBULATORY_CARE_PROVIDER_SITE_OTHER): Payer: Medicare Other | Admitting: Obstetrics and Gynecology

## 2022-03-18 ENCOUNTER — Encounter: Payer: Self-pay | Admitting: Obstetrics and Gynecology

## 2022-03-18 VITALS — BP 167/86 | HR 80 | Ht 63.0 in | Wt 138.4 lb

## 2022-03-18 DIAGNOSIS — Z01419 Encounter for gynecological examination (general) (routine) without abnormal findings: Secondary | ICD-10-CM | POA: Diagnosis not present

## 2022-03-18 NOTE — Progress Notes (Signed)
HPI: ?     Margaret Berg is a 71 y.o. [id number] who LMP was No LMP recorded. Patient is postmenopausal. ? ?Subjective:  ? ?She presents today for her annual examination.  She is generally doing well.  She is up-to-date on her Pap smears and mammography.  She has no specific complaints. ?She is a huge basketball fan and knows everything about March madness both men's and women's.  We had a very wonderful long discussion about basketball. ? ?  Hx: ?The following portions of the patient's history were reviewed and updated as appropriate: ?            She  has a past medical history of Anxiety, Arthritis, Bursitis of hip, Cancer ([place]), Depression, Dyspareunia, female, GERD (gastroesophageal reflux disease), HSV-2 (herpes simplex virus 2) infection, Hypothyroidism, Menopause, Motion sickness, Osteoporosis, Ovarian cystic mass, and Uterine mass. ?She does not have any pertinent problems on file. ?She  has a past surgical history that includes Colonoscopy with propofol (N/A, 07/20/2015); Bilateral salpingoophorectomy; Myomectomy; Excision basal cell carcinoma; Oophorectomy (Bilateral); Upper gi endoscopy; Colonoscopy with propofol (N/A, 08/03/2020); Breast biopsy (Right, 2013); Augmentation mammaplasty (Bilateral, 1979); Breast surgery (Bilateral, 10/30/2020); Cataract extraction w/PHACO (Right, 04/01/2021); and Cataract extraction w/PHACO (Left, 04/15/2021). ?Her family history includes Diabetes in her maternal grandfather; Osteoporosis in her maternal grandmother. ?She  reports that she has never smoked. She has never used smokeless tobacco. She reports current alcohol use. She reports that she does not use drugs. ?She has a current medication list which includes the following prescription(s): calcium-vitamin d, vitamin d3, epinephrine, glucosamine-chondroitin, lorazepam, omeprazole, valacyclovir, and venlafaxine. ?She is allergic to codeine and tape. ?      ?Review of Systems:  ?Review of Systems ? ?Constitutional:  Denied constitutional symptoms, night sweats, recent illness, fatigue, fever, insomnia and weight loss.  ?Eyes: Denied eye symptoms, eye pain, photophobia, vision change and visual disturbance.  ?Ears/Nose/Throat/Neck: Denied ear, nose, throat or neck symptoms, hearing loss, nasal discharge, sinus congestion and sore throat.  ?Cardiovascular: Denied cardiovascular symptoms, arrhythmia, chest pain/pressure, edema, exercise intolerance, orthopnea and palpitations.  ?Respiratory: Denied pulmonary symptoms, asthma, pleuritic pain, productive sputum, cough, dyspnea and wheezing.  ?Gastrointestinal: Denied, gastro-esophageal reflux, melena, nausea and vomiting.  ?Genitourinary: Denied genitourinary symptoms including symptomatic vaginal discharge, pelvic relaxation issues, and urinary complaints.  ?Musculoskeletal: Denied musculoskeletal symptoms, stiffness, swelling, muscle weakness and myalgia.  ?Dermatologic: Denied dermatology symptoms, rash and scar.  ?Neurologic: Denied neurology symptoms, dizziness, headache, neck pain and syncope.  ?Psychiatric: Denied psychiatric symptoms, anxiety and depression.  ?Endocrine: Denied endocrine symptoms including hot flashes and night sweats.  ? ?Meds: ?  ?Current Outpatient Medications on File Prior to Visit  ?Medication Sig Dispense Refill  ? Calcium-Vitamin D 600-200 MG-UNIT tablet Take by mouth.    ? Cholecalciferol (VITAMIN D3) 2000 units capsule Take by mouth.    ? EPINEPHrine 0.3 mg/0.3 mL IJ SOAJ injection Inject into the muscle.    ? glucosamine-chondroitin 500-400 MG tablet Take 1 tablet by mouth 3 (three) times daily.    ? LORazepam (ATIVAN) 0.5 MG tablet Take 0.5 mg by mouth at bedtime as needed for anxiety.    ? omeprazole (PRILOSEC) 20 MG capsule Take 1 capsule by mouth daily.    ? valACYclovir (VALTREX) 1000 MG tablet Take by mouth.    ? venlafaxine (EFFEXOR) 75 MG tablet     ? ?No current facility-administered medications on file prior to visit.  ? ? ? ? ?Objective:   ?  ? ?Vitals:  ?  03/18/22 1437  ?BP: (!) 167/86  ?Pulse: 80  ?  ?Filed Weights  ? 03/18/22 1437  ?Weight: 138 lb 6.4 oz (62.8 kg)  ? ?  ?         Physical examination ?General NAD, Conversant  ?HEENT Atraumatic; Op clear with mmm.  Normo-cephalic. Pupils reactive. Anicteric sclerae  ?Thyroid/Neck Smooth without nodularity or enlargement. Normal ROM.  Neck Supple.  ?Skin No rashes, lesions or ulceration. Normal palpated skin turgor. No nodularity.  ?Breasts: No masses or discharge.  Symmetric.  No axillary adenopathy.  Bilateral implants  ?Lungs: Clear to auscultation.No rales or wheezes. Normal Respiratory effort, no retractions.  ?Heart: NSR.  No murmurs or rubs appreciated. No periferal edema  ?Abdomen: Soft.  Non-tender.  No masses.  No HSM. No hernia  ?Extremities: Moves all appropriately.  Normal ROM for age. No lymphadenopathy.  ?Neuro: Oriented to PPT.  Normal mood. Normal affect.  ? ?  Pelvic:   ?Vulva: Normal appearance.  No lesions.  ?Vagina: No lesions or abnormalities noted.  ?Support: Normal pelvic support.  ?Urethra No masses tenderness or scarring.  ?Meatus Normal size without lesions or prolapse.  ?Cervix: Normal appearance.  No lesions.  ?Anus: Normal exam.  No lesions.  ?Perineum: Normal exam.  No lesions.  ?      Bimanual   ?Uterus: Normal size.  Non-tender.  Mobile.  AV.  ?Adnexae: No masses.  Non-tender to palpation.  ?Cul-de-sac: Negative for abnormality.  ? ? ? ?Assessment:  ?  ?G2P2002 ?Patient Active Problem List  ? Diagnosis Date Noted  ? Status post bilateral salpingo-oophorectomy (BSO) 03/03/2017  ? Vaginal atrophy 03/03/2017  ? H/O bilateral breast implants 03/03/2017  ? Allergy to environmental factors 11/06/2014  ? Degeneration of intervertebral disc of lumbar region 08/28/2014  ? Nerve root inflammation 08/28/2014  ? Anxiety 06/29/2014  ? Clinical depression 06/29/2014  ? Adult hypothyroidism 06/29/2014  ? Surgical menopause 06/29/2014  ? OP (osteoporosis) 06/29/2014  ? Bursitis of  hip 05/16/2014  ? H/O malignant neoplasm of skin 08/26/2013  ? H/O neoplasm 08/26/2013  ? Basal cell carcinoma of face 08/24/2012  ? ?  ?1. Well woman exam with routine gynecological exam   ? ?  ? ? ?Plan:  ?  ?       ? 1.  Basic Screening Recommendations ?The basic screening recommendations for asymptomatic women were discussed with the patient during her visit.  The age-appropriate recommendations were discussed with her and the rational for the tests reviewed.  When I am informed by the patient that another primary care physician has previously obtained the age-appropriate tests and they are up-to-date, only outstanding tests are ordered and referrals given as necessary.  Abnormal results of tests will be discussed with her when all of her results are completed.  Routine preventative health maintenance measures emphasized: Exercise/Diet/Weight control, Tobacco Warnings, Alcohol/Substance use risks and Stress Management ?Patient has aged out of Pap smears.  She will likely continue to get mammography for the next few years.  Blood work through primary care doctor. ? ? ?Orders ?No orders of the defined types were placed in this encounter. ? ? No orders of the defined types were placed in this encounter. ?    ?  ?  F/U ? Return in about 1 year (around 03/19/2023) for Annual Physical. ? ?Finis Bud, M.D. ?03/18/2022 ?4:31 PM ? ? ? ?

## 2022-03-18 NOTE — Progress Notes (Signed)
Patients presents for annual exam today. She states a cyst like knot in her right breast that occasionally gives her a burst of pain on the right and left side.Patient is up to date on pap smear, mammograms and colonoscopy. ?Patient states no other questions or concerns at this time.  ? ?

## 2022-06-24 ENCOUNTER — Other Ambulatory Visit: Payer: Self-pay | Admitting: Family Medicine

## 2022-06-24 DIAGNOSIS — Z1231 Encounter for screening mammogram for malignant neoplasm of breast: Secondary | ICD-10-CM

## 2022-07-28 ENCOUNTER — Ambulatory Visit
Admission: RE | Admit: 2022-07-28 | Discharge: 2022-07-28 | Disposition: A | Discharge status: Home / Self Care | Payer: Medicare Other | Source: Ambulatory Visit | Attending: Family Medicine | Admitting: Family Medicine

## 2022-07-28 DIAGNOSIS — Z1231 Encounter for screening mammogram for malignant neoplasm of breast: Secondary | ICD-10-CM | POA: Insufficient documentation

## 2023-03-18 ENCOUNTER — Ambulatory Visit (INDEPENDENT_AMBULATORY_CARE_PROVIDER_SITE_OTHER): Payer: Medicare Other | Admitting: Obstetrics and Gynecology

## 2023-03-18 ENCOUNTER — Encounter: Payer: Self-pay | Admitting: Obstetrics and Gynecology

## 2023-03-18 VITALS — BP 146/82 | HR 84 | Ht 63.0 in | Wt 135.3 lb

## 2023-03-18 DIAGNOSIS — Z01419 Encounter for gynecological examination (general) (routine) without abnormal findings: Secondary | ICD-10-CM

## 2023-03-18 DIAGNOSIS — Z0000 Encounter for general adult medical examination without abnormal findings: Secondary | ICD-10-CM

## 2023-03-18 NOTE — Progress Notes (Signed)
HPI:      [name] is a 72 y.o. [phone] who LMP was No LMP recorded. Patient is postmenopausal.  Subjective:   She presents today for her annual examination.  She reports that she is doing well.  Denies pelvic issues.  She does feel as if the cyst in her right breast is slightly bigger.  It is not tender.  Her mammogram in August shows no issues.  She reports no nipple discharge.  Very excited about March madness both men's and women's brackets!!!    Hx: The following portions of the patient's history were reviewed and updated as appropriate:             She  has a past medical history of Anxiety, Arthritis, Bursitis of hip, Cancer ([place]), Depression, Dyspareunia, female, GERD (gastroesophageal reflux disease), HSV-2 (herpes simplex virus 2) infection, Hypothyroidism, Menopause, Motion sickness, Osteoporosis, Ovarian cystic mass, and Uterine mass. She does not have any pertinent problems on file. She  has a past surgical history that includes Colonoscopy with propofol (N/A, 07/20/2015); Bilateral salpingoophorectomy; Myomectomy; Excision basal cell carcinoma; Oophorectomy (Bilateral); Upper gi endoscopy; Colonoscopy with propofol (N/A, 08/03/2020); Breast biopsy (Right, 2013); Augmentation mammaplasty (Bilateral, 1979); Breast surgery (Bilateral, 10/30/2020); Cataract extraction w/PHACO (Right, 04/01/2021); and Cataract extraction w/PHACO (Left, 04/15/2021). Her family history includes Diabetes in her maternal grandfather; Osteoporosis in her maternal grandmother. She  reports that she has never smoked. She has never used smokeless tobacco. She reports current alcohol use. She reports that she does not use drugs. She has a current medication list which includes the following prescription(s): calcium-vitamin d, vitamin d3, glucosamine-chondroitin, lorazepam, omeprazole, valacyclovir, and venlafaxine. She is allergic to codeine and tape.       Review of Systems:  Review of  Systems  Constitutional: Denied constitutional symptoms, night sweats, recent illness, fatigue, fever, insomnia and weight loss.  Eyes: Denied eye symptoms, eye pain, photophobia, vision change and visual disturbance.  Ears/Nose/Throat/Neck: Denied ear, nose, throat or neck symptoms, hearing loss, nasal discharge, sinus congestion and sore throat.  Cardiovascular: Denied cardiovascular symptoms, arrhythmia, chest pain/pressure, edema, exercise intolerance, orthopnea and palpitations.  Respiratory: Denied pulmonary symptoms, asthma, pleuritic pain, productive sputum, cough, dyspnea and wheezing.  Gastrointestinal: Denied, gastro-esophageal reflux, melena, nausea and vomiting.  Genitourinary: See HPI for additional information.  Musculoskeletal: Denied musculoskeletal symptoms, stiffness, swelling, muscle weakness and myalgia.  Dermatologic: Denied dermatology symptoms, rash and scar.  Neurologic: Denied neurology symptoms, dizziness, headache, neck pain and syncope.  Psychiatric: Denied psychiatric symptoms, anxiety and depression.  Endocrine: Denied endocrine symptoms including hot flashes and night sweats.   Meds:   Current Outpatient Medications on File Prior to Visit  Medication Sig Dispense Refill   Calcium-Vitamin D 600-200 MG-UNIT tablet Take by mouth.     Cholecalciferol (VITAMIN D3) 2000 units capsule Take by mouth.     glucosamine-chondroitin 500-400 MG tablet Take 1 tablet by mouth 3 (three) times daily.     LORazepam (ATIVAN) 0.5 MG tablet Take 0.5 mg by mouth at bedtime as needed for anxiety.     omeprazole (PRILOSEC) 20 MG capsule Take 1 capsule by mouth daily.     valACYclovir (VALTREX) 1000 MG tablet Take by mouth.     venlafaxine (EFFEXOR) 75 MG tablet      No current facility-administered medications on file prior to visit.     Objective:     Vitals:   03/18/23 1113  BP: (!) 146/82  Pulse: 84    Filed Weights  03/18/23 1113  Weight: 135 lb 4.8 oz (61.4 kg)               Physical examination General NAD, Conversant  HEENT Atraumatic; Op clear with mmm.  Normo-cephalic. Pupils reactive. Anicteric sclerae  Thyroid/Neck Smooth without nodularity or enlargement. Normal ROM.  Neck Supple.  Skin No rashes, lesions or ulceration. Normal palpated skin turgor. No nodularity.  Breasts: No masses or discharge.  Symmetric.  No axillary adenopathy.  Implants bilaterally  Lungs: Clear to auscultation.No rales or wheezes. Normal Respiratory effort, no retractions.  Heart: NSR.  No murmurs or rubs appreciated. No peripheral edema  Abdomen: Soft.  Non-tender.  No masses.  No HSM. No hernia  Extremities: Moves all appropriately.  Normal ROM for age. No lymphadenopathy.  Neuro: Oriented to PPT.  Normal mood. Normal affect.     Pelvic: Declined     Assessment:    [id number] Patient Active Problem List   Diagnosis Date Noted   Status post bilateral salpingo-oophorectomy (BSO) 03/03/2017   Vaginal atrophy 03/03/2017   H/O bilateral breast implants 03/03/2017   Allergy to environmental factors 11/06/2014   Degeneration of intervertebral disc of lumbar region 08/28/2014   Nerve root inflammation 08/28/2014   Anxiety 06/29/2014   Clinical depression 06/29/2014   Adult hypothyroidism 06/29/2014   Surgical menopause 06/29/2014   OP (osteoporosis) 06/29/2014   Bursitis of hip 05/16/2014   H/O malignant neoplasm of skin 08/26/2013   H/O neoplasm 08/26/2013   Basal cell carcinoma of face 08/24/2012     1. Encounter for annual physical exam        Plan:            1.  Basic Screening Recommendations The basic screening recommendations for asymptomatic women were discussed with the patient during her visit.  The age-appropriate recommendations were discussed with her and the rational for the tests reviewed.  When I am informed by the patient that another primary care physician has previously obtained the age-appropriate tests and they are up-to-date, only  outstanding tests are ordered and referrals given as necessary.  Abnormal results of tests will be discussed with her when all of her results are completed.  Routine preventative health maintenance measures emphasized: Exercise/Diet/Weight control, Tobacco Warnings, Alcohol/Substance use risks and Stress Management 2.  Breast exam difficult secondary to implants-recommend continued mammography Orders No orders of the defined types were placed in this encounter.   No orders of the defined types were placed in this encounter.         F/U  No follow-ups on file.  Finis Bud, M.D. 03/18/2023 11:41 AM

## 2023-03-18 NOTE — Progress Notes (Signed)
Patients presents for annual exam today. She states right breast cyst concern. Up to date with mammogram. Annual labs are declined. She states no other questions or concerns at this time.

## 2023-03-20 ENCOUNTER — Ambulatory Visit: Payer: Medicare Other | Admitting: Obstetrics and Gynecology

## 2023-03-20 ENCOUNTER — Encounter: Payer: Self-pay | Admitting: Obstetrics and Gynecology

## 2023-03-20 DIAGNOSIS — Z01419 Encounter for gynecological examination (general) (routine) without abnormal findings: Secondary | ICD-10-CM

## 2023-06-18 ENCOUNTER — Other Ambulatory Visit: Payer: Self-pay | Admitting: Family Medicine

## 2023-06-18 DIAGNOSIS — Z1231 Encounter for screening mammogram for malignant neoplasm of breast: Secondary | ICD-10-CM

## 2023-07-30 ENCOUNTER — Ambulatory Visit
Admission: RE | Admit: 2023-07-30 | Discharge: 2023-07-30 | Disposition: A | Discharge status: Home / Self Care | Payer: Medicare Other | Source: Ambulatory Visit | Attending: Family Medicine | Admitting: Family Medicine

## 2023-07-30 DIAGNOSIS — Z1231 Encounter for screening mammogram for malignant neoplasm of breast: Secondary | ICD-10-CM | POA: Diagnosis present

## 2024-06-28 ENCOUNTER — Other Ambulatory Visit: Payer: Self-pay | Admitting: Family Medicine

## 2024-06-28 DIAGNOSIS — Z1231 Encounter for screening mammogram for malignant neoplasm of breast: Secondary | ICD-10-CM

## 2024-08-01 ENCOUNTER — Ambulatory Visit
Admission: RE | Admit: 2024-08-01 | Discharge: 2024-08-01 | Disposition: A | Discharge status: Home / Self Care | Source: Ambulatory Visit | Attending: Family Medicine | Admitting: Family Medicine

## 2024-08-01 DIAGNOSIS — Z1231 Encounter for screening mammogram for malignant neoplasm of breast: Secondary | ICD-10-CM | POA: Diagnosis present

## 2024-11-11 ENCOUNTER — Encounter: Payer: Self-pay | Admitting: Family Medicine

## 2024-11-11 DIAGNOSIS — M810 Age-related osteoporosis without current pathological fracture: Secondary | ICD-10-CM

## 2024-11-15 ENCOUNTER — Other Ambulatory Visit: Payer: Self-pay | Admitting: Physical Medicine and Rehabilitation

## 2024-11-15 DIAGNOSIS — M5416 Radiculopathy, lumbar region: Secondary | ICD-10-CM

## 2024-11-16 ENCOUNTER — Other Ambulatory Visit: Payer: Self-pay | Admitting: Family Medicine

## 2024-11-16 DIAGNOSIS — M818 Other osteoporosis without current pathological fracture: Secondary | ICD-10-CM

## 2024-11-21 ENCOUNTER — Ambulatory Visit
Admission: RE | Admit: 2024-11-21 | Discharge: 2024-11-21 | Disposition: A | Discharge status: Home / Self Care | Source: Ambulatory Visit | Attending: Physical Medicine and Rehabilitation | Admitting: Physical Medicine and Rehabilitation

## 2024-11-21 DIAGNOSIS — M5416 Radiculopathy, lumbar region: Secondary | ICD-10-CM

## 2024-11-22 ENCOUNTER — Other Ambulatory Visit

## 2024-11-29 ENCOUNTER — Ambulatory Visit
Admission: RE | Admit: 2024-11-29 | Discharge: 2024-11-29 | Disposition: A | Source: Ambulatory Visit | Attending: Family Medicine | Admitting: Family Medicine

## 2024-11-29 DIAGNOSIS — M818 Other osteoporosis without current pathological fracture: Secondary | ICD-10-CM | POA: Diagnosis present
